# Patient Record
Sex: Female | Born: 1980 | Race: White | Hispanic: Yes | Marital: Married | Smoking: Never smoker
Health system: Southern US, Community
[De-identification: ages and names within clinical notes are randomized; demographics above are authoritative.]

## PROBLEM LIST (undated history)

## (undated) ENCOUNTER — Inpatient Hospital Stay (HOSPITAL_COMMUNITY): Payer: Self-pay

## (undated) DIAGNOSIS — O24419 Gestational diabetes mellitus in pregnancy, unspecified control: Secondary | ICD-10-CM

## (undated) DIAGNOSIS — R87613 High grade squamous intraepithelial lesion on cytologic smear of cervix (HGSIL): Secondary | ICD-10-CM

## (undated) HISTORY — DX: Gestational diabetes mellitus in pregnancy, unspecified control: O24.419

## (undated) HISTORY — PX: LEEP: SHX91

---

## 2008-06-26 ENCOUNTER — Inpatient Hospital Stay (HOSPITAL_COMMUNITY): Admission: AD | Admit: 2008-06-26 | Discharge: 2008-06-30 | Payer: Self-pay | Admitting: Obstetrics & Gynecology

## 2009-10-19 ENCOUNTER — Emergency Department (HOSPITAL_COMMUNITY): Admission: EM | Admit: 2009-10-19 | Discharge: 2009-10-19 | Payer: Self-pay | Admitting: Emergency Medicine

## 2010-01-31 ENCOUNTER — Ambulatory Visit (HOSPITAL_COMMUNITY): Admission: RE | Admit: 2010-01-31 | Discharge: 2010-01-31 | Payer: Self-pay | Admitting: Family Medicine

## 2010-02-27 ENCOUNTER — Ambulatory Visit (HOSPITAL_COMMUNITY): Admission: RE | Admit: 2010-02-27 | Discharge: 2010-02-27 | Payer: Self-pay | Admitting: Obstetrics & Gynecology

## 2010-06-03 ENCOUNTER — Inpatient Hospital Stay (HOSPITAL_COMMUNITY): Admission: AD | Admit: 2010-06-03 | Discharge: 2010-06-06 | Payer: Self-pay | Admitting: Family Medicine

## 2010-06-03 ENCOUNTER — Ambulatory Visit: Payer: Self-pay | Admitting: Advanced Practice Midwife

## 2011-02-23 LAB — CBC
HCT: 35.5 % — ABNORMAL LOW (ref 36.0–46.0)
Hemoglobin: 12.1 g/dL (ref 12.0–15.0)
Platelets: 205 10*3/uL (ref 150–400)
WBC: 7.4 10*3/uL (ref 4.0–10.5)

## 2011-02-23 LAB — RPR: RPR Ser Ql: NONREACTIVE

## 2011-04-22 NOTE — Op Note (Signed)
Kristi Nguyen, Kristi Nguyen   ACCOUNT NO.:  [id number]   MEDICAL RECORD NO.:  [id number]          PATIENT TYPE:  INP   LOCATION:  9124                          FACILITY:  [facility]   PHYSICIAN:  Charles A. Clearance Coots, M.D.DATE OF BIRTH:  [birth year]   DATE OF PROCEDURE:  06/27/2008  DATE OF DISCHARGE:                               OPERATIVE REPORT   PREOPERATIVE DIAGNOSIS:  Arrest of descent, occiput posterior position.   POSTOPERATIVE DIAGNOSIS:  Arrest of descent, occiput posterior position.   PROCEDURE:  Primary low transverse cesarean section.   SURGEON:  Charles A. Clearance Coots, M.D.   ASSISTANT:  Trinna Balloon, surgical technician   ANESTHESIA:  Epidural.   ESTIMATED BLOOD LOSS:  800 mL.   IV FLUIDS:  2300 mL.   URINE OUTPUT:  200 mL, clear.   COMPLICATIONS:  None.   DRAINS:  Foley to gravity.   FINDINGS:  Viable female at 2158, Apgars of 9 at one minute, 9 at five  minutes.  Weight of 10 pounds and 2 ounces.  Normal uterus, ovaries, and  fallopian tubes.   OPERATION:  The patient was brought to the operating room and after a  satisfactory redosing of the epidural, the abdomen was prepped and  draped in a usual sterile fashion.  A Pfannenstiel skin incision was  made with the scalpel that was deepened down to the fascia with a  scalpel.  Fascia was nicked in the midline and the fascial incision was  extended to the left and to the right with curved Mayo scissors.  The  superior and inferior fascial edges were taken off of the rectus muscle  with blunt and sharp dissections.  The rectus muscle was sharply divided  in the midline superiorly and inferiorly being careful to avoid the  urinary bladder inferiorly.  Peritoneum was then entered digitally and  was digitally extended to the left and to the right.  The bladder blade  was positioned and the vesicouterine fold of peritoneum above the  reflection of urinary bladder was grasped with a forceps and was incised  and  undermined with Metzenbaum scissors.  The incision was extended to  the left and to the right with the Metzenbaum scissors.  The bladder  flap was then bluntly developed and the bladder blade was repositioned  in front of the urinary bladder placing it well out of the operative  field.  The uterus was then entered transversely in the lower uterine  segment with a scalpel.  Purulent appearing amniotic fluid was expelled.  The uterine incision was then extended to left and to the right with the  bandage scissors.  The vertex was noted to be in the left occiput  posterior position and the occiput was rotated anteriorly into the  incision and the delivery was accomplished with the aid of fundal  pressure from the assistant.  Infant's mouth and nose were suctioned  with a suction bulb, and the delivery was completed with the aid of  fundal pressure from the assistant.  Umbilical cord was doubly clamped  and cut and the infant was handed off to the nursery staff.  Cord blood  was obtained and the  placenta was manually removed from the uterus  intact.  The endometrial surface was thoroughly debrided with a dry lap  sponge.  The edges of the uterine incision were grasped with a ring  forceps.  The uterus was closed with a continuous interlocking suture of  0-Monocryl from each corner to the center.  Hemostasis was excellent.  Pelvic cavity was thoroughly irrigated with warm saline solution and all  clots were removed.  The abdomen was then closed as follows.  Peritoneum  was closed with continuous suture of 2-0 Monocryl.  The fascia was  closed with a continuous suture of 0-Vicryl.  Subcutaneous tissue was  thoroughly irrigated with warm saline solution and all areas of  subcutaneous bleeding were coagulated with the Bovie.  The skin was then  approximated with surgical stainless steel staples.  Sterile bandage was  applied to the incision closure.  The patient tolerated the procedure  well, was  transported to the recovery room in satisfactory condition.      Charles A. Clearance Coots, M.D.  Electronically Signed     CAH/MEDQ  D:  06/27/2008  T:  06/28/2008  Job:  [id number]

## 2011-04-25 NOTE — Discharge Summary (Signed)
Kristi Nguyen, Kristi Nguyen   ACCOUNT NO.:  [id number]   MEDICAL RECORD NO.:  [id number]          PATIENT TYPE:  INP   LOCATION:  9124                          FACILITY:  [facility]   PHYSICIAN:  Roseanna Rainbow, M.D.DATE OF BIRTH:  05-17-1981   DATE OF ADMISSION:  06/26/2008  DATE OF DISCHARGE:  06/30/2008                               DISCHARGE SUMMARY   CHIEF COMPLAINT:  The patient is a 30 year old gravida 2, para 1 with an  estimated date of confinement of June 16, 2008, who presents for  induction of labor secondary to post-dates.   HISTORY OF PRESENT ILLNESS:  Prenatal care uncomplicated to date, GBS  negative.   PAST SURGICAL HISTORY:  She denies.   PAST MEDICAL HISTORY:  She denies.   MEDICATIONS:  Prenatal vitamins.   ALLERGIES:  No known drug allergies.   SOCIAL HISTORY:  She is married.  She denies any tobacco, ethanol, or  drug use.   FAMILY HISTORY:  No major illnesses.   PHYSICAL EXAMINATION:  GENERAL:  Afebrile.  VITAL SIGNS:  Stable.  LUNGS:  Clear to auscultation bilaterally.  HEART:  Regular rate and rhythm.  ABDOMEN:  Gravid, nontender.  PELVIC:  The cervix is 2 cm dilated, 50% effaced with vertex at -2  station.   ASSESSMENT:  Primipara at term post-dates.   PLAN:  Admission and two-stage induction of labor.   HOSPITAL COURSE:  The patient was admitted.  The cervix was ripened.  She was then started on low-dose Pitocin.  She progressed to fully  dilated.  There was no further descent beyond +1 station.  She developed  fever and was felt to have possible evolving chorioamnionitis.  The  position was occiput-posterior.  The decision was made to proceed with a  cesarean delivery.  Please see the dictated operative summary.  On  postoperative day #1, her hemoglobin was 7.7.  She was hemodynamically  stable.  The remainder of her hospital course was uneventful.  She was  discharged to home on postoperative day #3, tolerating a regular diet.   DISCHARGE DIAGNOSIS:  Intrauterine pregnancy at term, arrest of descent,  persistent occiput-posterior presentation.   PROCEDURE:  Cesarean delivery.   CONDITION:  Stable.   DIET:  Regular.   ACTIVITY:  Progressive activity, pelvic rest.   MEDICATIONS:  Included Percocet, Micronor, prenatal vitamins, and  ferrous sulfate.   DISPOSITION:  The patient was to follow up in the office in 2 weeks.      Roseanna Rainbow, M.D.  Electronically Signed     LAJ/MEDQ  D:  07/21/2008  T:  07/22/2008  Job:  [id number]

## 2011-09-05 LAB — CBC
Hemoglobin: 11.5 — ABNORMAL LOW
Hemoglobin: 7.7 — CL
MCHC: 32.9
MCV: 88.8
MCV: 89.4
Platelets: 187
Platelets: 244
RBC: 3.93
RDW: 16 — ABNORMAL HIGH
WBC: 9.2

## 2012-12-08 DIAGNOSIS — R87613 High grade squamous intraepithelial lesion on cytologic smear of cervix (HGSIL): Secondary | ICD-10-CM

## 2012-12-08 HISTORY — DX: High grade squamous intraepithelial lesion on cytologic smear of cervix (HGSIL): R87.613

## 2012-12-20 ENCOUNTER — Encounter: Payer: Self-pay | Admitting: Obstetrics & Gynecology

## 2013-01-07 ENCOUNTER — Encounter: Payer: Self-pay | Admitting: Obstetrics & Gynecology

## 2013-01-07 ENCOUNTER — Ambulatory Visit (INDEPENDENT_AMBULATORY_CARE_PROVIDER_SITE_OTHER): Payer: Self-pay | Admitting: Obstetrics & Gynecology

## 2013-01-07 VITALS — BP 115/86 | HR 90 | Temp 97.9°F | Ht 62.0 in | Wt 141.0 lb

## 2013-01-07 DIAGNOSIS — IMO0002 Reserved for concepts with insufficient information to code with codable children: Secondary | ICD-10-CM

## 2013-01-07 DIAGNOSIS — R87613 High grade squamous intraepithelial lesion on cytologic smear of cervix (HGSIL): Secondary | ICD-10-CM

## 2013-01-07 DIAGNOSIS — N879 Dysplasia of cervix uteri, unspecified: Secondary | ICD-10-CM | POA: Insufficient documentation

## 2013-01-07 NOTE — Patient Instructions (Addendum)
Cervical Dysplasia Cervical dysplasia is a condition in which a woman has abnormal changes in the cells of her cervix. The cervix is the opening to the uterus (womb) between the vagina and the uterus. These changes are called cervical dysplasia and may be the first signs of cervical cancer. These cells can be taken from the cervix during a Pap test and then looked at under a microscope. With early detection, treatment, and close follow-up care, nearly all cervical dysplasia can be cured. If untreated, the mild to moderate stages of dysplasia often grow more severe.  RISK FACTORS  The following increase the risk for cervical dysplasia.  Having had a sexually transmitted disease, including:  Chlamydia.  Human papilloma virus (HPV).  Becoming sexually active before age 18.  Having had more than 1 sexual partner.  Not using protection, such as condoms, during sexual intercourse, especially with new sexual partners.  Having had cancer of the vagina or vulva.  Having a sexual partner whose previous partner had cancer of the cervix or cervical dysplasia.  Having a sexual partner who has or has had cancer of the penis.  Having a weakened immune system (HIV, organ transplant).  Being the daughter of a woman who took DES (diethylstilbestrol) during pregnancy.  A history of cervical cancer in a woman's sister or mother.  Smoking.  Having had an abnormal Pap test in the past. SYMPTOMS  There are usually no symptoms. If there are symptoms, they may be vague such as:  Abnormal vaginal discharge.  Bleeding between periods or following intercourse.  Bleeding during menopause.  Pain on intercourse (dyspareunia). DIAGNOSIS   The Pap test is the best way of detecting abnormalities of the cervix.  Biopsy (removing a piece of tissue to look at under the microscope) of the cervix when the Pap test is abnormal or when the Pap test is normal, but the cervix looks abnormal. TREATMENT   Catching and treating the changes early with Pap tests can prevent cervical cancer.  Cryotherapy freezes the abnormal cells with a steel tip instrument.  A laser can be used to remove the abnormal cells.  Loop electrocautery excision procedure (LEEP). This procedure uses a heated electrical loop to remove a cone-like portion of the cervix, including the cervical canal.  For more serious cases of cervical dysplasia, the abnormal tissue may be removed surgically by:  A cone biopsy (by cold knife, laser or LEEP). A procedure in which a portion of the center of the cervix with the cervical canal is removed.  The uterus and cervix are removed (hysterectomy). Your caregiver will advise you regarding the need and timing of Pap tests in your follow-up. Women who have been treated for dysplasia should be closely followed with pelvic exams and Pap tests. During the first year following treatment of cervical dysplasia, Pap tests should be done every 3 to 4 months. In the second year, the schedule is every 6 months, or as recommended by your caregiver. See your caregiver for new or worsening problems. HOME CARE INSTRUCTIONS   Follow the instructions and recommendations of your caregiver regarding medicines and follow-up appointments.  Only take over-the-counter or prescription medicines for pain or discomfort as directed by your caregiver.  Cramping and pelvic discomfort may follow cryotherapy. It is not abnormal to have watery discharge for several weeks after.  Laser, cone surgery, cryotherapy or LEEP can cause a bad smelling vaginal discharge. It may also cause vaginal bleeding for a couple weeks following the procedure. The discharge   may be black from the paste used to control bleeding from the cone site. This is normal.  Do not use tampons, have sexual intercourse or douche until your caregiver says it is okay. SEEK MEDICAL CARE IF:   You develop genital warts.  You need a prescription for  pain medicine following your treatment. SEEK IMMEDIATE MEDICAL CARE IF:   Your bleeding is heavier than a normal menstrual period.  You develop bright red bleeding, especially if you have blood clots.  You have a fever.  You have increasing cramps or pain not relieved with medicine.  You are lightheaded, unusually weak, or have fainting spells.  You have abnormal vaginal discharge.  You develop abdominal pain. PREVENTION   The surest way to prevent cervical dysplasia is to abstain from sexual intercourse.  Practice safe sex, use condoms and have only one sex partner who does not have other sex partners.  A Pap test is done to screen for cervical cancer.  The first Pap test should be done at age 21.  Between ages 21 and 29, Pap tests are repeated every 2 years.  Beginning at age 30, you are advised to have a Pap test every 3 years as long as your past 3 Pap tests have been normal.  Some women have medical problems that increase the chance of getting cervical cancer. Talk to your caregiver about these problems. It is especially important to talk to your caregiver if a new problem develops soon after your last Pap test. In these cases, your caregiver may recommend more frequent screening and Pap tests.  The above recommendations are the same for women who have or have not gotten the vaccine for HPV (Human Papillomavirus).  If you had a hysterectomy for a problem that was not a cancer or a condition that could lead to cancer, then you no longer need Pap tests. However, even if you no longer need a Pap test, a regular exam is a good idea to make sure no other problems are starting.   If you are between ages 65 and 70, and you have had normal Pap tests going back 10 years, you no longer need Pap tests. However, even if you no longer need a Pap test, a regular exam is a good idea to make sure no other problems are starting.   If you have had past treatment for cervical cancer or a  condition that could lead to cancer, you need Pap tests and screening for cancer for at least 20 years after your treatment.  If Pap tests have been discontinued, risk factors (such as a new sexual partner) need to be re-assessed to determine if screening should be resumed.  Some women may need screenings more often if they are at high risk for cervical cancer.  Your caregiver may do additional tests including:  Colposcopy. A procedure in which a special microscope magnifies the cells and allows the provider to closely examine the cervix, vagina, and vulva.  Biopsy. A small tissue sample is taken from the cervix, vagina or vulva. This is generally done in your caregivers office.  A cone biopsy (cold knife or laser). A large tissue sample is taken from the cervix. This procedure is usually done in an operating room under a general anesthetic. The cone often removes all abnormal tissue and so may also complete the treatment.  LEEP, also removing a circular portion of the cervix and is done in a doctors office under a local anesthetic.  Now there   is a vaccine, Gardasil, that was developed to prevent the HPV'S that can cause cancer of the cervix and genital warts. It is recommended for females ages 9 to 26. It should not be given to pregnant women until more is known about its effects on the fetus. Not all cancers of the cervix are caused by the HPV. Routine gynecology exams and Pap tests should continue as recommended by your caregiver. Document Released: 11/24/2005 Document Revised: 02/16/2012 Document Reviewed: 11/15/2008 ExitCare Patient Information 2013 [employer]. Procedimiento de escisin electroquirrgica con asa (Loop Electrosurgical Excision Procedure) El procedimiento de escisin electroquirrgica con asa es la extirpacin de una porcin de la parte inferior del tero (cuello). El procedimento se realiza cuando hay cambios significativamente anormales en las clulas del cuello del  tero. Estos cambios pueden causar cncer si se dejan sin tratar.   El procedimiento mismo slo demora algunos minutos. Generalmente se realiza en el consultorio del mdico. Se considera un procedimiento seguro para aquellas mujeres que desean o tratan de quedar embarazadas. Solo bajo raras circunstancias este procedimiento se realiza estando embarazada.  INFORME A SU MDICO:   Si est embarazada o no tuvo el ltimo perodo menstrual.  Alergias a alimentos o medicamentos.  Todos los medicamentos que utiliza, incluyendo vitaminas, hierbas, gotas oftlmicas, medicamentos de venta libre y cremas.  Uso de corticoides (por va oral o cremas).  Problemas anteriores debido a anestsicos o a medicamentos que disminuyen la sensibilidad.  Cirugas ginecolgicas previas.  Antecedentes de hemorragias o cogulos sanguneos.  Infecciones recientes o actuales en la vagina (herpes, enfermedades de transmisin sexual).  Otros problemas de salud. RIESGOS Y COMPLICACIONES  Sangrado.  Infecciones.  Lesiones en la vagina, la vejiga o el recto.  Obstruccin rara en la abertura del cuello que causa problemas durante la menstruacin (estenosis cervical). ANTES DEL PROCEDIMIENTO   No tome aspirina ni anticoagulantes durante la semana previa al procedimiento, o segn le hayan indicado.  Consuma una comida ligera antes del procedimiento.  Consulte a su mdico si debe cambiar o suspender los medicamentos que toma habitualmente.  Le administrarn un analgsico 1  2 horas antes del procedimiento. PROCEDIMIENTO   Se coloca un instrumento (espculo) en la vagina. Esto le permite al mdico observar el cuello.  Se aplica tintura de yodo para encontrar la zona de las clulas anormales.  Se inyecta un medicamento para adormecer el cuello (anestsico local).   Se pasa electricidad a travs de una delgada asa de alambre que luego se usa para extirpar (cauterizar) un pequeo segmento de la zona  afectada.  Se utiliza una ligera electrocauterizacin para sellar los vasos sanguneos pequeos y evitar el sangrado.  Aplicarn una pasta en la zona cauterizada para prevenir el sangrado.  La muestra de tejido se enva al laboratorio. Luego, se examina en el microscopio. DESPUS DEL PROCEDIMIENTO   Pdale a alguna persona que la lleve hasta su casa.  Puede sentir un clico moderado a suave.  Puede notar una secrecin vaginal negra por la pasta usada para prevenir el sangrado. Esto es normal.  Observe si tiene un sangrado excesivo. Esto requiere atencin mdica inmediata.  Consulte con su mdico la fecha en que los resultados estarn disponibles. Asegrese de obtener los resultados. Document Released: 08/06/2011 Document Revised: 02/16/2012 ExitCare Patient Information 2013 [employer].   

## 2013-01-07 NOTE — Progress Notes (Signed)
Subjective:     Patient ID: Kristi Nguyen, female   DOB: [birth year], 32 y.o.   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  She was referred for LEEP.   History reviewed. No pertinent past medical history.  Past Surgical History  Procedure Date  . Cesarean section    No current outpatient prescriptions on file prior to visit.   No Known Allergies  History   Social History  . Marital Status: Single    Spouse Name: N/A    Number of Children: N/A  . Years of Education: N/A   Occupational History  . Not on file.   Social History Main Topics  . Smoking status: Never Smoker   . Smokeless tobacco: Never Used  . Alcohol Use: No  . Drug Use: No  . Sexually Active: Yes    Birth Control/ Protection: IUD   Other Topics Concern  . Not on file   Social History Narrative  . 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  Pt has watched the video and I have reviewed with her the risks of dysplasia and the risks of the procdure.  Pt expressed understanding.  All conversation through spanish interpreter.  Pt want to speak to her husband prior to the procedure.     Plan:     F/u for LEEP

## 2013-01-28 ENCOUNTER — Other Ambulatory Visit: Payer: Self-pay | Admitting: Obstetrics & Gynecology

## 2013-01-28 ENCOUNTER — Other Ambulatory Visit (HOSPITAL_COMMUNITY)
Admission: RE | Admit: 2013-01-28 | Discharge: 2013-01-28 | Disposition: A | Discharge status: Home / Self Care | Payer: Self-pay | Source: Ambulatory Visit | Attending: Obstetrics & Gynecology | Admitting: Obstetrics & Gynecology

## 2013-01-28 ENCOUNTER — Ambulatory Visit (INDEPENDENT_AMBULATORY_CARE_PROVIDER_SITE_OTHER): Payer: Self-pay | Admitting: Obstetrics & Gynecology

## 2013-01-28 ENCOUNTER — Encounter: Payer: Self-pay | Admitting: Obstetrics & Gynecology

## 2013-01-28 VITALS — BP 115/82 | HR 87 | Temp 97.2°F | Ht 61.0 in | Wt 144.4 lb

## 2013-01-28 DIAGNOSIS — IMO0002 Reserved for concepts with insufficient information to code with codable children: Secondary | ICD-10-CM

## 2013-01-28 DIAGNOSIS — R87613 High grade squamous intraepithelial lesion on cytologic smear of cervix (HGSIL): Secondary | ICD-10-CM

## 2013-01-28 DIAGNOSIS — D069 Carcinoma in situ of cervix, unspecified: Secondary | ICD-10-CM | POA: Insufficient documentation

## 2013-01-28 LAB — POCT PREGNANCY, URINE
Preg Test, Ur: NEGATIVE
Preg Test, Ur: NEGATIVE

## 2013-01-28 NOTE — Patient Instructions (Signed)
Procedimiento de escisin electroquirrgica con asa - Cuidados posteriores  (Loop Electrosurgical Excision Procedure, Care After) Siga estas instrucciones durante las prximas semanas. Estas indicaciones le proporcionan informacin general acerca de cmo deber cuidarse despus del procedimiento. El mdico tambin podr darle instrucciones especficas. El tratamiento ha sido planificado segn las prcticas mdicas actuales, pero en algunos casos pueden ocurrir problemas. Comunquese con el mdico si tiene algn problema o tiene preguntas despus del procedimiento.  INSTRUCCIONES PARA EL CUIDADO EN EL HOGAR   No use tampones, no se d duchas vaginales ni tenga relaciones sexuales durante 2 semanas, o segn lo que le indique su mdico.  Comience con las actividades habituales si no tiene o tiene mnimo de clicos y Landscape architect, excepto que el mdico le indique lo contrario.  Tmese la temperatura si se siente enfermo. Anote la temperatura en un papel e informe a su mdico que tiene fiebre.  Tome todos los medicamentos segn le indic su mdico.  Cumpla con todas las visitas de control y los papanicolau, segn le indique su mdico. SOLICITE ATENCIN MDICA DE INMEDIATO SI:   Tiene un sangrado ms abundante o que dura ms que el ciclo menstrual normal.  Tiene un sangrado de color rojo brillante.  Elimina cogulos de [place].  Tiene fiebre.  Siente clicos o el dolor no se alivia con Tourist information centre manager.  Siente dolor abdominal que no parece estar relacionado con la misma zona en que sinti los clicos y Chief Technology Officer.  Se siente mareada, dbil o se desmaya.  Comienza a Financial risk analyst al orinar u [employer].  Tiene una secrecin vaginal con mal olor. ASEGRESE DE QUE:   Comprende estas instrucciones.  Controlar su enfermedad.  Solicitar ayuda de inmediato si no mejora o si empeora. Document Released: 08/07/2011 Document Revised: 02/16/2012 [facility] Patient Information 2013 [place],  [place]. Procedimiento de escisin electroquirrgica con asa (Loop Electrosurgical Excision Procedure) El procedimiento de escisin electroquirrgica con asa es la extirpacin de una porcin de la parte inferior del tero (cuello). El procedimento se realiza cuando hay cambios significativamente anormales en las clulas del cuello del tero. Estos cambios pueden causar cncer si se dejan sin tratar.   El procedimiento mismo slo demora algunos minutos. Generalmente se Electronics engineer del mdico. Se considera un procedimiento seguro para aquellas mujeres que desean o tratan de quedar embarazadas. Solo bajo raras circunstancias este procedimiento se realiza estando embarazada.  INFORME A SU MDICO:   Si est embarazada o no tuvo el ltimo perodo menstrual.  Alergias a alimentos o medicamentos.  Todos los [employer], incluyendo vitaminas, hierbas, gotas oftlmicas, medicamentos de [place] y Control and instrumentation engineer.  Uso de corticoides (por va oral o cremas).  Problemas anteriores debido a anestsicos o a medicamentos que [employer] sensibilidad.  Cirugas ginecolgicas previas.  Antecedentes de hemorragias o cogulos sanguneos.  Infecciones recientes o actuales en la vagina (herpes, enfermedades de transmisin sexual).  Otros problemas de [place]. RIESGOS Y COMPLICACIONES  Sangrado.  Infecciones.  Lesiones en la vagina, la vejiga o el recto.  Obstruccin rara en la abertura del cuello que causa problemas durante la menstruacin (estenosis cervical). ANTES DEL PROCEDIMIENTO   No tome aspirina ni anticoagulantes durante la semana previa al procedimiento, o segn le hayan indicado.  Consuma una comida ligera antes del procedimiento.  Consulte a su mdico si debe cambiar o suspender los medicamentos que toma habitualmente.  Le administrarn un analgsico 1  2 horas antes del procedimiento. PROCEDIMIENTO   Se coloca un instrumento (espculo) en  la vagina. Esto le  permite al mdico observar el cuello.  Se aplica tintura de yodo para encontrar la zona de las clulas anormales.  Se inyecta un medicamento para adormecer el cuello (anestsico local).   Se pasa electricidad a travs de una delgada asa de alambre que luego se [place] para extirpar (cauterizar) un pequeo segmento de la zona afectada.  Se utiliza una ligera electrocauterizacin para sellar los vasos sanguneos pequeos y Social research officer, government.  Aplicarn una pasta en la zona cauterizada para prevenir el sangrado.  La muestra de tejido se enva al laboratorio. Luego, se examina en el microscopio. DESPUS DEL PROCEDIMIENTO   Pdale a alguna persona que la lleve hasta su casa.  Puede sentir un clico moderado a suave.  Puede notar una secrecin vaginal negra por la pasta usada para prevenir el sangrado. Esto es normal.  Observe si tiene un sangrado excesivo. Esto requiere atencin mdica inmediata.  Consulte con su mdico la fecha en que los resultados estarn disponibles. Asegrese de [employer]. Document Released: 08/06/2011 Document Revised: 02/16/2012 [facility] Patient Information 2013 [place], [place].

## 2013-01-28 NOTE — Progress Notes (Signed)
Patient ID: Kristi Nguyen, female   DOB: 1981/08/24, 31 y.o.   MRN: [id number] Pap smear and colposcopy reviewed.   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  Role of HPV,cervical dysplasia and need for close followup was empasized. Informed written consent was obtained. All questions were answered. Time out performed.  Procedure: The patient was placed in lithotomy position and the bivalved coated speculum was placed in the patient's vagina. A grounding pad placed on the patient. Local anesthesia was administered via an intracervical block using 10cc of 2% Lidocaine with epinephrine. The suction was turned on and the Large 1X Fisher Cone Biopsy Excisor on 50 Watts of cutting current was used to excise the area of decreased uptake and excise the entire transformation zone. Excellent hemostasis was achieved using roller ball coagulation set at 60 Watts coagulation current. Monsel's solution was then applied and excellent hemostasis was noted.  The speculum was removed from the vagina. Specimens were sent to pathology. The patient tolerated the procedure well. Post-operative instructions given to patient, including instruction to seek medical attention for persistent bright red bleeding, fever, abdominal/pelvic pain, dysuria, nausea or vomiting. She was also told about the possibility of having copious yellow to black tinged discharge. She was counseled to avoid anything in the vagina (sex/douching/tampons) for 4 weeks. She has a  4 week post-operative check to review results and assess wound healing.

## 2013-02-25 ENCOUNTER — Encounter: Payer: Self-pay | Admitting: Obstetrics & Gynecology

## 2013-02-25 ENCOUNTER — Ambulatory Visit (INDEPENDENT_AMBULATORY_CARE_PROVIDER_SITE_OTHER): Payer: Self-pay | Admitting: Obstetrics & Gynecology

## 2013-02-25 VITALS — BP 125/75 | HR 76 | Temp 97.6°F | Ht 62.0 in | Wt 146.1 lb

## 2013-02-25 DIAGNOSIS — N879 Dysplasia of cervix uteri, unspecified: Secondary | ICD-10-CM

## 2013-02-25 NOTE — Progress Notes (Signed)
Subjective:     Patient ID: Kristi Nguyen, female   DOB: [birth year], 32 y.o.   MRN: [id number]  HPI Pt presents for f/u after LEEP.  She has no complaints.  No bleeding or abnormal discharge.   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 SEE COMMENT. - ECTOCERVICAL AND ENDOCERVICAL MARGINS, NEGATIVE FOR DYSPLASIA.     Assessment:     dysplasia s/p LEEP margins clear    Plan:     F/u 6 months for repeat PAP F/u sooner prn

## 2013-02-25 NOTE — Patient Instructions (Addendum)
Cervical Dysplasia Cervical dysplasia is a condition in which a woman has abnormal changes in the cells of her cervix. The cervix is the opening to the uterus (womb) between the vagina and the uterus. These changes are called cervical dysplasia and may be the first signs of cervical cancer. These cells can be taken from the cervix during a Pap test and then looked at under a microscope. With early detection, treatment, and close follow-up care, nearly all cervical dysplasia can be cured. If untreated, the mild to moderate stages of dysplasia often grow more severe.  RISK FACTORS  The following increase the risk for cervical dysplasia.  Having had a sexually transmitted disease, including:  Chlamydia.  Human papilloma virus (HPV).  Becoming sexually active before age 18.  Having had more than 1 sexual partner.  Not using protection, such as condoms, during sexual intercourse, especially with new sexual partners.  Having had cancer of the vagina or vulva.  Having a sexual partner whose previous partner had cancer of the cervix or cervical dysplasia.  Having a sexual partner who has or has had cancer of the penis.  Having a weakened immune system (HIV, organ transplant).  Being the daughter of a woman who took DES (diethylstilbestrol) during pregnancy.  A history of cervical cancer in a woman's sister or mother.  Smoking.  Having had an abnormal Pap test in the past. SYMPTOMS  There are usually no symptoms. If there are symptoms, they may be vague such as:  Abnormal vaginal discharge.  Bleeding between periods or following intercourse.  Bleeding during menopause.  Pain on intercourse (dyspareunia). DIAGNOSIS   The Pap test is the best way of detecting abnormalities of the cervix.  Biopsy (removing a piece of tissue to look at under the microscope) of the cervix when the Pap test is abnormal or when the Pap test is normal, but the cervix looks abnormal. TREATMENT    Catching and treating the changes early with Pap tests can prevent cervical cancer.  Cryotherapy freezes the abnormal cells with a steel tip instrument.  A laser can be used to remove the abnormal cells.  Loop electrocautery excision procedure (LEEP). This procedure uses a heated electrical loop to remove a cone-like portion of the cervix, including the cervical canal.  For more serious cases of cervical dysplasia, the abnormal tissue may be removed surgically by:  A cone biopsy (by cold knife, laser or LEEP). A procedure in which a portion of the center of the cervix with the cervical canal is removed.  The uterus and cervix are removed (hysterectomy). Your caregiver will advise you regarding the need and timing of Pap tests in your follow-up. Women who have been treated for dysplasia should be closely followed with pelvic exams and Pap tests. During the first year following treatment of cervical dysplasia, Pap tests should be done every 3 to 4 months. In the second year, the schedule is every 6 months, or as recommended by your caregiver. See your caregiver for new or worsening problems. HOME CARE INSTRUCTIONS   Follow the instructions and recommendations of your caregiver regarding medicines and follow-up appointments.  Only take over-the-counter or prescription medicines for pain or discomfort as directed by your caregiver.  Cramping and pelvic discomfort may follow cryotherapy. It is not abnormal to have watery discharge for several weeks after.  Laser, cone surgery, cryotherapy or LEEP can cause a bad smelling vaginal discharge. It may also cause vaginal bleeding for a couple weeks following the procedure. The   discharge may be black from the paste used to control bleeding from the cone site. This is normal.  Do not use tampons, have sexual intercourse or douche until your caregiver says it is okay. SEEK MEDICAL CARE IF:   You develop genital warts.  You need a prescription for  pain medicine following your treatment. SEEK IMMEDIATE MEDICAL CARE IF:   Your bleeding is heavier than a normal menstrual period.  You develop bright red bleeding, especially if you have blood clots.  You have a fever.  You have increasing cramps or pain not relieved with medicine.  You are lightheaded, unusually weak, or have fainting spells.  You have abnormal vaginal discharge.  You develop abdominal pain. PREVENTION   The surest way to prevent cervical dysplasia is to abstain from sexual intercourse.  Practice safe sex, use condoms and have only one sex partner who does not have other sex partners.  A Pap test is done to screen for cervical cancer.  The first Pap test should be done at age 21.  Between ages 21 and 29, Pap tests are repeated every 2 years.  Beginning at age 30, you are advised to have a Pap test every 3 years as long as your past 3 Pap tests have been normal.  Some women have medical problems that increase the chance of getting cervical cancer. Talk to your caregiver about these problems. It is especially important to talk to your caregiver if a new problem develops soon after your last Pap test. In these cases, your caregiver may recommend more frequent screening and Pap tests.  The above recommendations are the same for women who have or have not gotten the vaccine for HPV (Human Papillomavirus).  If you had a hysterectomy for a problem that was not a cancer or a condition that could lead to cancer, then you no longer need Pap tests. However, even if you no longer need a Pap test, a regular exam is a good idea to make sure no other problems are starting.   If you are between ages 65 and 70, and you have had normal Pap tests going back 10 years, you no longer need Pap tests. However, even if you no longer need a Pap test, a regular exam is a good idea to make sure no other problems are starting.   If you have had past treatment for cervical cancer or a  condition that could lead to cancer, you need Pap tests and screening for cancer for at least 20 years after your treatment.  If Pap tests have been discontinued, risk factors (such as a new sexual partner) need to be re-assessed to determine if screening should be resumed.  Some women may need screenings more often if they are at high risk for cervical cancer.  Your caregiver may do additional tests including:  Colposcopy. A procedure in which a special microscope magnifies the cells and allows the provider to closely examine the cervix, vagina, and vulva.  Biopsy. A small tissue sample is taken from the cervix, vagina or vulva. This is generally done in your caregivers office.  A cone biopsy (cold knife or laser). A large tissue sample is taken from the cervix. This procedure is usually done in an operating room under a general anesthetic. The cone often removes all abnormal tissue and so may also complete the treatment.  LEEP, also removing a circular portion of the cervix and is done in a doctors office under a local anesthetic.  Now   there is a vaccine, Gardasil, that was developed to prevent the HPV'S that can cause cancer of the cervix and genital warts. It is recommended for females ages 9 to 26. It should not be given to pregnant women until more is known about its effects on the fetus. Not all cancers of the cervix are caused by the HPV. Routine gynecology exams and Pap tests should continue as recommended by your caregiver. Document Released: 11/24/2005 Document Revised: 02/16/2012 Document Reviewed: 11/15/2008 Exit[employer] Patient Information 2013 [employer].  

## 2013-08-12 ENCOUNTER — Ambulatory Visit (INDEPENDENT_AMBULATORY_CARE_PROVIDER_SITE_OTHER): Payer: Self-pay | Admitting: Obstetrics & Gynecology

## 2013-08-12 ENCOUNTER — Encounter: Payer: Self-pay | Admitting: Obstetrics & Gynecology

## 2013-08-12 VITALS — BP 116/68 | HR 81 | Temp 99.2°F | Ht 62.0 in | Wt 151.0 lb

## 2013-08-12 DIAGNOSIS — N879 Dysplasia of cervix uteri, unspecified: Secondary | ICD-10-CM

## 2013-08-12 DIAGNOSIS — R87613 High grade squamous intraepithelial lesion on cytologic smear of cervix (HGSIL): Secondary | ICD-10-CM

## 2013-08-12 DIAGNOSIS — IMO0002 Reserved for concepts with insufficient information to code with codable children: Secondary | ICD-10-CM

## 2013-08-12 NOTE — Patient Instructions (Signed)
Cervical Dysplasia  Cervical dysplasia is a condition in which a woman has abnormal changes in the cells of her cervix. The cervix is the opening to the uterus (womb) between the vagina and the uterus. These changes are called cervical dysplasia and may be the first signs of cervical cancer. These cells can be taken from the cervix during a Pap test and then looked at under a microscope.  With early detection, treatment, and close follow-up care, nearly all cervical dysplasia can be cured. If untreated, the mild to moderate stages of dysplasia often grow more severe.   RISK FACTORS   The following increase the risk for cervical dysplasia.  · Having had a sexually transmitted disease, including:  · Chlamydia.  · Human papilloma virus (HPV).  · Becoming sexually active before age 18.  · Having had more than 1 sexual partner.  · Not using protection, such as condoms, during sexual intercourse, especially with new sexual partners.  · Having had cancer of the vagina or vulva.  · Having a sexual partner whose previous partner had cancer of the cervix or cervical dysplasia.  · Having a sexual partner who has or has had cancer of the penis.  · Having a weakened immune system (HIV, organ transplant).  · Being the daughter of a woman who took DES (diethylstilbestrol) during pregnancy.  · A history of cervical cancer in a woman's sister or mother.  · Smoking.  · Having had an abnormal Pap test in the past.  SYMPTOMS   There are usually no symptoms. If there are symptoms, they may be vague such as:  · Abnormal vaginal discharge.  · Bleeding between periods or following intercourse.  · Bleeding during menopause.  · Pain on intercourse (dyspareunia).  DIAGNOSIS   · The Pap test is the best way of detecting abnormalities of the cervix.  · Biopsy (removing a piece of tissue to look at under the microscope) of the cervix when the Pap test is abnormal or when the Pap test is normal, but the cervix looks abnormal.  TREATMENT    Catching and treating the changes early with Pap tests can prevent cervical cancer.  · Cryotherapy freezes the abnormal cells with a steel tip instrument.  · A laser can be used to remove the abnormal cells.  · Loop electrocautery excision procedure (LEEP). This procedure uses a heated electrical loop to remove a cone-like portion of the cervix, including the cervical canal.  · For more serious cases of cervical dysplasia, the abnormal tissue may be removed surgically by:  · A cone biopsy (by cold knife, laser or LEEP). A procedure in which a portion of the center of the cervix with the cervical canal is removed.  · The uterus and cervix are removed (hysterectomy).  Your caregiver will advise you regarding the need and timing of Pap tests in your follow-up. Women who have been treated for dysplasia should be closely followed with pelvic exams and Pap tests. During the first year following treatment of cervical dysplasia, Pap tests should be done every 3 to 4 months. In the second year, the schedule is every 6 months, or as recommended by your caregiver. See your caregiver for new or worsening problems.  HOME CARE INSTRUCTIONS   · Follow the instructions and recommendations of your caregiver regarding medicines and follow-up appointments.  · Only take over-the-counter or prescription medicines for pain or discomfort as directed by your caregiver.  · Cramping and pelvic discomfort may follow cryotherapy. It   is not abnormal to have watery discharge for several weeks after.  · Laser, cone surgery, cryotherapy or LEEP can cause a bad smelling vaginal discharge. It may also cause vaginal bleeding for a couple weeks following the procedure. The discharge may be black from the paste used to control bleeding from the cone site. This is normal.  · Do not use tampons, have sexual intercourse or douche until your caregiver says it is okay.  SEEK MEDICAL CARE IF:   · You develop genital warts.  · You need a prescription for  pain medicine following your treatment.  SEEK IMMEDIATE MEDICAL CARE IF:   · Your bleeding is heavier than a normal menstrual period.  · You develop bright red bleeding, especially if you have blood clots.  · You have a fever.  · You have increasing cramps or pain not relieved with medicine.  · You are lightheaded, unusually weak, or have fainting spells.  · You have abnormal vaginal discharge.  · You develop abdominal pain.  PREVENTION   · The surest way to prevent cervical dysplasia is to abstain from sexual intercourse.  · Practice safe sex, use condoms and have only one sex partner who does not have other sex partners.  · A Pap test is done to screen for cervical cancer.  · The first Pap test should be done at age 21.  · Between ages 21 and 29, Pap tests are repeated every 2 years.  · Beginning at age 30, you are advised to have a Pap test every 3 years as long as your past 3 Pap tests have been normal.  · Some women have medical problems that increase the chance of getting cervical cancer. Talk to your caregiver about these problems. It is especially important to talk to your caregiver if a new problem develops soon after your last Pap test. In these cases, your caregiver may recommend more frequent screening and Pap tests.  · The above recommendations are the same for women who have or have not gotten the vaccine for HPV (Human Papillomavirus).  · If you had a hysterectomy for a problem that was not a cancer or a condition that could lead to cancer, then you no longer need Pap tests. However, even if you no longer need a Pap test, a regular exam is a good idea to make sure no other problems are starting.     · If you are between ages 65 and 70, and you have had normal Pap tests going back 10 years, you no longer need Pap tests. However, even if you no longer need a Pap test, a regular exam is a good idea to make sure no other problems are starting.     · If you have had past treatment for cervical cancer or a  condition that could lead to cancer, you need Pap tests and screening for cancer for at least 20 years after your treatment.   · If Pap tests have been discontinued, risk factors (such as a new sexual partner)  need to be re-assessed to determine if screening should be resumed.  · Some women may need screenings more often if they are at high risk for cervical cancer.  · Your caregiver may do additional tests including:  · Colposcopy. A procedure in which a special microscope magnifies the cells and allows the provider to closely examine the cervix, vagina, and vulva.  · Biopsy. A small tissue sample is taken from the cervix, vagina or vulva. This is generally done in your   caregivers office.  · A cone biopsy (cold knife or laser). A large tissue sample is taken from the cervix. This procedure is usually done in an operating room under a general anesthetic. The cone often removes all abnormal tissue and so may also complete the treatment.  · LEEP, also removing a circular portion of the cervix and is done in a doctors office under a local anesthetic.  · Now there is a vaccine, Gardasil, that was developed to prevent the HPV'S that can cause cancer of the cervix and genital warts. It is recommended for females ages 9 to 26. It should not be given to pregnant women until more is known about its effects on the fetus. Not all cancers of the cervix are caused by the HPV. Routine gynecology exams and Pap tests should continue as recommended by your caregiver.  Document Released: 11/24/2005 Document Revised: 02/16/2012 Document Reviewed: 11/15/2008  [employer]® Patient Information ©2014 [employer].

## 2013-08-12 NOTE — Progress Notes (Signed)
Subjective:     Patient ID: Kristi Nguyen, female   DOB: 1981-10-30, 32 y.o.   MRN: [id number]  HPI Pt presents for repeat PAP.  She denies problems.   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 SEE COMMENT. - ECTOCERVICAL AND ENDOCERVICAL MARGINS, NEGATIVE FOR DYSPLASIA. 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 (CRR:eps 01/31/13) [place] RUND DO Pathologist, Electronic Signature (Case signed 01/31/2013) Specimen Gross and Clinical Information Specimen(s) Obtained: Cervix, LEEP     Assessment:     h/o LEEP for high grade dysplasia with clean margins for repeat PAP      Plan:     F/u repeat PAP F/u 1 year or sooner prn

## 2014-09-07 ENCOUNTER — Other Ambulatory Visit: Payer: Self-pay | Admitting: Nurse Practitioner

## 2014-09-07 DIAGNOSIS — Z30431 Encounter for routine checking of intrauterine contraceptive device: Secondary | ICD-10-CM

## 2014-09-25 ENCOUNTER — Other Ambulatory Visit: Payer: Self-pay

## 2014-09-25 ENCOUNTER — Ambulatory Visit
Admission: RE | Admit: 2014-09-25 | Discharge: 2014-09-25 | Disposition: A | Discharge status: Home / Self Care | Payer: No Typology Code available for payment source | Source: Ambulatory Visit | Attending: Nurse Practitioner | Admitting: Nurse Practitioner

## 2014-09-25 DIAGNOSIS — Z30431 Encounter for routine checking of intrauterine contraceptive device: Secondary | ICD-10-CM

## 2018-01-31 ENCOUNTER — Encounter (HOSPITAL_COMMUNITY): Payer: Self-pay | Admitting: *Deleted

## 2018-01-31 ENCOUNTER — Emergency Department (HOSPITAL_COMMUNITY): Payer: Self-pay

## 2018-01-31 ENCOUNTER — Emergency Department (HOSPITAL_COMMUNITY)
Admission: EM | Admit: 2018-01-31 | Discharge: 2018-01-31 | Disposition: A | Discharge status: Home / Self Care | Payer: Self-pay | Attending: Emergency Medicine | Admitting: Emergency Medicine

## 2018-01-31 ENCOUNTER — Other Ambulatory Visit: Payer: Self-pay

## 2018-01-31 DIAGNOSIS — O200 Threatened abortion: Secondary | ICD-10-CM | POA: Insufficient documentation

## 2018-01-31 DIAGNOSIS — N939 Abnormal uterine and vaginal bleeding, unspecified: Secondary | ICD-10-CM

## 2018-01-31 DIAGNOSIS — O209 Hemorrhage in early pregnancy, unspecified: Secondary | ICD-10-CM | POA: Insufficient documentation

## 2018-01-31 DIAGNOSIS — O034 Incomplete spontaneous abortion without complication: Secondary | ICD-10-CM

## 2018-01-31 LAB — COMPREHENSIVE METABOLIC PANEL
ALT: 19 U/L (ref 14–54)
ANION GAP: 11 (ref 5–15)
AST: 23 U/L (ref 15–41)
Albumin: 3.9 g/dL (ref 3.5–5.0)
Alkaline Phosphatase: 66 U/L (ref 38–126)
BUN: <5 mg/dL — ABNORMAL LOW (ref 6–20)
CO2: 22 mmol/L (ref 22–32)
CREATININE: 0.64 mg/dL (ref 0.44–1.00)
Calcium: 8.8 mg/dL — ABNORMAL LOW (ref 8.9–10.3)
Chloride: 102 mmol/L (ref 101–111)
Glucose, Bld: 122 mg/dL — ABNORMAL HIGH (ref 65–99)
Potassium: 3.1 mmol/L — ABNORMAL LOW (ref 3.5–5.1)
SODIUM: 135 mmol/L (ref 135–145)
Total Bilirubin: 0.4 mg/dL (ref 0.3–1.2)
Total Protein: 7.3 g/dL (ref 6.5–8.1)

## 2018-01-31 LAB — HCG, QUANTITATIVE, PREGNANCY: hCG, Beta Chain, Quant, S: 2748 m[IU]/mL — ABNORMAL HIGH (ref <5)

## 2018-01-31 LAB — WET PREP, GENITAL
Clue Cells Wet Prep HPF POC: NONE SEEN
SPERM: NONE SEEN
Trich, Wet Prep: NONE SEEN
Yeast Wet Prep HPF POC: NONE SEEN

## 2018-01-31 LAB — URINALYSIS, ROUTINE W REFLEX MICROSCOPIC
Bacteria, UA: NONE SEEN
Squamous Epithelial / LPF: NONE SEEN

## 2018-01-31 LAB — CBC
HCT: 34.3 % — ABNORMAL LOW (ref 36.0–46.0)
HEMOGLOBIN: 11.3 g/dL — AB (ref 12.0–15.0)
MCH: 29.4 pg (ref 26.0–34.0)
MCHC: 32.9 g/dL (ref 30.0–36.0)
MCV: 89.3 fL (ref 78.0–100.0)
PLATELETS: 287 10*3/uL (ref 150–400)
RBC: 3.84 MIL/uL — AB (ref 3.87–5.11)
RDW: 14.3 % (ref 11.5–15.5)
WBC: 7.5 10*3/uL (ref 4.0–10.5)

## 2018-01-31 MED ORDER — SODIUM CHLORIDE 0.9 % IV BOLUS (SEPSIS)
1000.0000 mL | Freq: Once | INTRAVENOUS | Status: AC
  Administered 2018-01-31: 1000 mL via INTRAVENOUS

## 2018-01-31 NOTE — ED Triage Notes (Signed)
Pt has been coughing for several days, took cough medications tonight. Shortly after, pt began having heavy vaginal bleeding with large clots and lower abd pain. Pt missed her period in January and believes she could be pregnant.

## 2018-01-31 NOTE — Discharge Instructions (Signed)
1. Medications: usual home medications 2. Treatment: rest, drink plenty of fluids,  3. Follow Up: [facility] days for discussion of your diagnoses and further evaluation after today's visit; Please return to the ER for syncope, lightheadedness, worsening bleeding or fevers

## 2018-01-31 NOTE — ED Provider Notes (Signed)
[facility] EMERGENCY DEPARTMENT Provider Note   CSN: [id number] Arrival date & time: 01/31/18  0121     History   Chief Complaint Chief Complaint  Patient presents with  . Threatened Miscarriage    HPI Kristi Nguyen is a 37 y.o. female G2P0202 with a hx of cervical dysplasia presents to the Emergency Department complaining of gradual, persistent, progressively worsening vaginal bleeding.  Pt reports "normal menstrual bleeding"  Onset 3 days ago, but tonight around 7pm she developed cramping and heavier bleeding. Associated symptoms include lower abd cramping.  Pt has not taken a pregnancy test at home and is not under the care of an OB/GYN.  Nothing makes it better and nothing makes it worse.  Pt denies fever, chills, lightheadedness, dizziness, SOB, DOE, CP, dysuria.  LMP 12/10/17.  She reports she is sexually active with 1 female partner.  Pt also reports cough for several days.  Pt reports taking cough medication recently and is concerned this is a result of that.     The history is provided by the patient and medical records. The history is limited by a language barrier. A language interpreter was used.    History reviewed. No pertinent past medical history.  Patient Active Problem List   Diagnosis Date Noted  . 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 CESAREAN SECTION      OB History    Gravida Para Term Preterm AB Living   3 3 3     3    SAB TAB Ectopic Multiple Live Births                   Home Medications    Prior to Admission medications   Medication Sig Start Date End Date Taking? Authorizing Provider  guaifenesin (ROBITUSSIN) 100 MG/5ML syrup Take 200 mg by mouth 3 (three) times daily as needed for cough.   Yes [provider]    Family History No family history on file.  Social History Social History   Tobacco Use  . Smoking status: Never Smoker  . Smokeless tobacco:  Never Used  Substance Use Topics  . Alcohol use: No  . Drug use: No     Allergies   Patient has no known allergies.   Review of Systems Review of Systems  Constitutional: Negative for appetite change, diaphoresis, fatigue, fever and unexpected weight change.  HENT: Negative for mouth sores.   Eyes: Negative for visual disturbance.  Respiratory: Positive for cough. Negative for chest tightness, shortness of breath and wheezing.   Cardiovascular: Negative for chest pain.  Gastrointestinal: Positive for abdominal pain (lower). Negative for constipation, diarrhea, nausea and vomiting.  Endocrine: Negative for polydipsia, polyphagia and polyuria.  Genitourinary: Positive for vaginal bleeding. Negative for dysuria, frequency, hematuria and urgency.  Musculoskeletal: Negative for back pain and neck stiffness.  Skin: Negative for rash.  Allergic/Immunologic: Negative for immunocompromised state.  Neurological: Negative for syncope, light-headedness and headaches.  Hematological: Does not bruise/bleed easily.  Psychiatric/Behavioral: Negative for sleep disturbance. The patient is not nervous/anxious.      Physical Exam Updated Vital Signs BP (!) 150/98 (BP Location: Right Arm)   Pulse (!) 136   Temp 98.7 F (37.1 C) (Oral)   Resp 15   SpO2 100%   Physical Exam  Constitutional: She appears well-developed and well-nourished. No distress.  Awake, alert, nontoxic appearance  HENT:  Head: Normocephalic and atraumatic.  Mouth/Throat: Oropharynx is clear and moist.  No oropharyngeal exudate.  Eyes: Conjunctivae are normal. No scleral icterus.  Neck: Normal range of motion. Neck supple.  Cardiovascular: Regular rhythm, normal heart sounds and intact distal pulses. Tachycardia present.  No murmur heard. Pulses:      Radial pulses are 2+ on the right side, and 2+ on the left side.  Pulmonary/Chest: Effort normal and breath sounds normal. No respiratory distress. She has no wheezes.    Equal chest expansion  Abdominal: Soft. Bowel sounds are normal. She exhibits no mass. There is no tenderness. There is no rebound and no guarding. Hernia confirmed negative in the right inguinal area and confirmed negative in the left inguinal area.  Genitourinary: Uterus normal. No labial fusion. There is no rash, tenderness or lesion on the right labia. There is no rash, tenderness or lesion on the left labia. Uterus is not deviated, not enlarged, not fixed and not tender. Cervix exhibits no motion tenderness, no discharge and no friability. Right adnexum displays no mass, no tenderness and no fullness. Left adnexum displays no mass, no tenderness and no fullness. There is bleeding in the vagina. No erythema or tenderness in the vagina. No foreign body in the vagina. No signs of injury around the vagina. No vaginal discharge found.  Genitourinary Comments: Chaperone present Cervical Os open Products of conception in vaginal vault  Musculoskeletal: Normal range of motion. She exhibits no edema.  Lymphadenopathy:       Right: No inguinal adenopathy present.       Left: No inguinal adenopathy present.  Neurological: She is alert.  Speech is clear and goal oriented Moves extremities without ataxia  Skin: Skin is warm and dry. She is not diaphoretic. No erythema.  Psychiatric: She has a normal mood and affect.  Nursing note and vitals reviewed.    ED Treatments / Results  Labs (all labs ordered are listed, but only abnormal results are displayed) Labs Reviewed  WET PREP, GENITAL - Abnormal; Notable for the following components:      Result Value   WBC, Wet Prep HPF POC MODERATE (*)    All other components within normal limits  COMPREHENSIVE METABOLIC PANEL - Abnormal; Notable for the following components:   Potassium 3.1 (*)    Glucose, Bld 122 (*)    BUN <5 (*)    Calcium 8.8 (*)    All other components within normal limits  CBC - Abnormal; Notable for the following components:    RBC 3.84 (*)    Hemoglobin 11.3 (*)    HCT 34.3 (*)    All other components within normal limits  HCG, QUANTITATIVE, PREGNANCY - Abnormal; Notable for the following components:   hCG, Beta Chain, Quant, S 2,748 (*)    All other components within normal limits  URINALYSIS, ROUTINE W REFLEX MICROSCOPIC - Abnormal; Notable for the following components:   Color, Urine RED (*)    APPearance CLOUDY (*)    Glucose, UA   (*)    Value: TEST NOT REPORTED DUE TO COLOR INTERFERENCE OF URINE PIGMENT   Hgb urine dipstick   (*)    Value: TEST NOT REPORTED DUE TO COLOR INTERFERENCE OF URINE PIGMENT   Bilirubin Urine   (*)    Value: TEST NOT REPORTED DUE TO COLOR INTERFERENCE OF URINE PIGMENT   Ketones, ur   (*)    Value: TEST NOT REPORTED DUE TO COLOR INTERFERENCE OF URINE PIGMENT   Protein, ur   (*)    Value: TEST NOT REPORTED DUE  TO COLOR INTERFERENCE OF URINE PIGMENT   Nitrite   (*)    Value: TEST NOT REPORTED DUE TO COLOR INTERFERENCE OF URINE PIGMENT   Leukocytes, UA   (*)    Value: TEST NOT REPORTED DUE TO COLOR INTERFERENCE OF URINE PIGMENT   All other components within normal limits  URINALYSIS, ROUTINE W REFLEX MICROSCOPIC  GC/CHLAMYDIA PROBE AMP ([place]) NOT AT [facility]  SURGICAL PATHOLOGY    Radiology US Ob Comp < 14 Wks  Result Date: 01/31/2018 CLINICAL DATA:  37 year old pregnant female with vaginal bleeding. LMP: 11/09/2017 corresponding to an estimated gestational age of [redacted] weeks, 6 days. EXAM: OBSTETRIC <14 WK [place] AND TRANSVAGINAL OB US TECHNIQUE: Both transabdominal and transvaginal ultrasound examinations were performed for complete evaluation of the gestation as well as the maternal uterus, adnexal regions, and pelvic cul-de-sac. Transvaginal technique was performed to assess early pregnancy. COMPARISON:  None. FINDINGS: The uterus is anteverted. The endometrium is thickened and heterogeneous measuring 14 mm. No intrauterine pregnancy identified. Heterogeneous content in the  endometrium is suspicious for proteinaceous debris or blood products. There is an area of vascularity to the upper endometrium. Findings consistent with pregnancy of unknown location with differentials include an early pregnancy, recent spontaneous abortion, or an ectopic pregnancy. However, the constellation of findings is concerning for recent spontaneous abortion. Correlation with clinical exam and serial HCG levels and follow-up with ultrasound recommended. The ovaries appear unremarkable. The right ovary measures 2.7 x 1.9 x 1.8 cm and the left ovary measures 2.4 x 1.8 x 2.5 cm. No free fluid noted within the pelvis. IMPRESSION: No intrauterine pregnancy identified. Findings may represent an early pregnancy, recent spontaneous abortion, or ectopic pregnancy. The constellation of findings is however concerning for recent abortion. Clinical correlation and follow-up with serial HCG levels and ultrasound recommended. Heterogeneous content within the endometrium likely represents proteinaceous material or blood product. An area of vascularity to the upper endometrium noted and therefore retained products of conception is not entirely excluded. No free fluid within pelvis. Electronically Signed   By: Elgie Collard M.D.   On: 01/31/2018 05:02   US Ob Transvaginal  Result Date: 01/31/2018 CLINICAL DATA:  37 year old pregnant female with vaginal bleeding. LMP: 11/09/2017 corresponding to an estimated gestational age of [redacted] weeks, 6 days. EXAM: OBSTETRIC <14 WK [place] AND TRANSVAGINAL OB US TECHNIQUE: Both transabdominal and transvaginal ultrasound examinations were performed for complete evaluation of the gestation as well as the maternal uterus, adnexal regions, and pelvic cul-de-sac. Transvaginal technique was performed to assess early pregnancy. COMPARISON:  None. FINDINGS: The uterus is anteverted. The endometrium is thickened and heterogeneous measuring 14 mm. No intrauterine pregnancy identified. Heterogeneous  content in the endometrium is suspicious for proteinaceous debris or blood products. There is an area of vascularity to the upper endometrium. Findings consistent with pregnancy of unknown location with differentials include an early pregnancy, recent spontaneous abortion, or an ectopic pregnancy. However, the constellation of findings is concerning for recent spontaneous abortion. Correlation with clinical exam and serial HCG levels and follow-up with ultrasound recommended. The ovaries appear unremarkable. The right ovary measures 2.7 x 1.9 x 1.8 cm and the left ovary measures 2.4 x 1.8 x 2.5 cm. No free fluid noted within the pelvis. IMPRESSION: No intrauterine pregnancy identified. Findings may represent an early pregnancy, recent spontaneous abortion, or ectopic pregnancy. The constellation of findings is however concerning for recent abortion. Clinical correlation and follow-up with serial HCG levels and ultrasound recommended. Heterogeneous content within the endometrium likely  represents proteinaceous material or blood product. An area of vascularity to the upper endometrium noted and therefore retained products of conception is not entirely excluded. No free fluid within pelvis. Electronically Signed   By: Elgie CollardArash  Radparvar M.D.   On: 01/31/2018 05:02    Procedures Procedures (including critical care time)  Medications Ordered in ED Medications  sodium chloride 0.9 % bolus 1,000 mL (1,000 mLs Intravenous New Bag/Given 01/31/18 0505)     Initial Impression / Assessment and Plan / ED Course  I have reviewed the triage vital signs and the nursing notes.  Pertinent labs & imaging results that were available during my care of the patient were reviewed by me and considered in my medical decision making (see chart for details).     Presents with lower abdominal pain and vaginal bleeding.  She is pregnant.  Pelvic exam with products of conception in the vaginal vault.  Cervical office is open.   Moderate amount of bleeding.  Ultrasound shows no intrauterine pregnancy and no evidence of ectopic pregnancy.  Heterogeneous content within the endometrium likely represents material blood product.  Patient is afebrile without tachycardia.  Mild anemia noted.  Her bleeding has slowed significantly since removal of the products of conception from her vaginal vault.  Products sent to pathology lab.  Repeat abdominal exam is benign, soft and nontender.  Patient needs 48-hour follow-up with OB/GYN for repeat quant and physical examination.  She is to return immediately to the emergency department for worsening bleeding, worsening pain, fevers, lightheadedness or near syncope.  She states understanding and is in agreement with this plan.    Final Clinical Impressions(s) / ED Diagnoses   Final diagnoses:  Incomplete miscarriage  Vaginal bleeding    ED Discharge Orders    None       Mardene SayerMuthersbaugh, Boyd KerbsHannah, PA-C 01/31/18 [id number]    Geoffery Lyonselo, Douglas, MD 01/31/18 [id number]

## 2018-02-01 LAB — GC/CHLAMYDIA PROBE AMP (~~LOC~~) NOT AT ARMC
CHLAMYDIA, DNA PROBE: NEGATIVE
NEISSERIA GONORRHEA: NEGATIVE

## 2019-01-01 LAB — CHG URINE PREGNANCY TEST: Preg Test, Ur: POSITIVE

## 2019-01-04 ENCOUNTER — Inpatient Hospital Stay (HOSPITAL_COMMUNITY)
Admission: AD | Admit: 2019-01-04 | Discharge: 2019-01-04 | Disposition: A | Discharge status: Home / Self Care | Payer: Self-pay | Attending: Obstetrics and Gynecology | Admitting: Obstetrics and Gynecology

## 2019-01-04 ENCOUNTER — Inpatient Hospital Stay (HOSPITAL_COMMUNITY): Payer: Self-pay

## 2019-01-04 ENCOUNTER — Other Ambulatory Visit: Payer: Self-pay

## 2019-01-04 ENCOUNTER — Encounter (HOSPITAL_COMMUNITY): Payer: Self-pay | Admitting: *Deleted

## 2019-01-04 DIAGNOSIS — O26899 Other specified pregnancy related conditions, unspecified trimester: Secondary | ICD-10-CM

## 2019-01-04 DIAGNOSIS — O26891 Other specified pregnancy related conditions, first trimester: Secondary | ICD-10-CM

## 2019-01-04 DIAGNOSIS — R109 Unspecified abdominal pain: Secondary | ICD-10-CM | POA: Insufficient documentation

## 2019-01-04 DIAGNOSIS — O4691 Antepartum hemorrhage, unspecified, first trimester: Secondary | ICD-10-CM

## 2019-01-04 DIAGNOSIS — O4690 Antepartum hemorrhage, unspecified, unspecified trimester: Secondary | ICD-10-CM

## 2019-01-04 DIAGNOSIS — O209 Hemorrhage in early pregnancy, unspecified: Secondary | ICD-10-CM | POA: Insufficient documentation

## 2019-01-04 DIAGNOSIS — Z3A01 Less than 8 weeks gestation of pregnancy: Secondary | ICD-10-CM | POA: Insufficient documentation

## 2019-01-04 DIAGNOSIS — O3680X0 Pregnancy with inconclusive fetal viability, not applicable or unspecified: Secondary | ICD-10-CM

## 2019-01-04 HISTORY — DX: High grade squamous intraepithelial lesion on cytologic smear of cervix (HGSIL): R87.613

## 2019-01-04 LAB — URINALYSIS, ROUTINE W REFLEX MICROSCOPIC
BACTERIA UA: NONE SEEN
BILIRUBIN URINE: NEGATIVE
Glucose, UA: NEGATIVE mg/dL
Ketones, ur: NEGATIVE mg/dL
LEUKOCYTES UA: NEGATIVE
Nitrite: NEGATIVE
Protein, ur: NEGATIVE mg/dL
SPECIFIC GRAVITY, URINE: 1.023 (ref 1.005–1.030)
pH: 5 (ref 5.0–8.0)

## 2019-01-04 LAB — CBC
HCT: 33.3 % — ABNORMAL LOW (ref 36.0–46.0)
HEMOGLOBIN: 10.6 g/dL — AB (ref 12.0–15.0)
MCH: 27.2 pg (ref 26.0–34.0)
MCHC: 31.8 g/dL (ref 30.0–36.0)
MCV: 85.4 fL (ref 80.0–100.0)
PLATELETS: 357 10*3/uL (ref 150–400)
RBC: 3.9 MIL/uL (ref 3.87–5.11)
RDW: 15.4 % (ref 11.5–15.5)
WBC: 6.7 10*3/uL (ref 4.0–10.5)
nRBC: 0 % (ref 0.0–0.2)

## 2019-01-04 LAB — HCG, QUANTITATIVE, PREGNANCY: hCG, Beta Chain, Quant, S: 1995 m[IU]/mL — ABNORMAL HIGH (ref <5)

## 2019-01-04 LAB — WET PREP, GENITAL
Clue Cells Wet Prep HPF POC: NONE SEEN
SPERM: NONE SEEN
TRICH WET PREP: NONE SEEN
YEAST WET PREP: NONE SEEN

## 2019-01-04 LAB — POCT PREGNANCY, URINE: PREG TEST UR: POSITIVE — AB

## 2019-01-04 LAB — ABO/RH: ABO/RH(D): O POS

## 2019-01-04 NOTE — MAU Note (Signed)
Pt states she started bleeding on Jan 14, was light, became heavier last night.    Was expecting period on Jan 9.  Lower abdominal pain x 3 days.

## 2019-01-04 NOTE — MAU Provider Note (Addendum)
History     CSN: [id number]  Arrival date and time: 01/04/19 [id number]   First Provider Initiated Contact with Patient 01/04/19 1035      Chief Complaint  Patient presents with  . Vaginal Bleeding  . Abdominal Pain   Kristi Nguyen is a 38 y.o. B2W4132G5P3013 female presenting for possible miscarriage. She has not been on birth control for 1739yrs. She reports LMP of 11/15/18 with light spotting that began 12/21/2018 and progressed to heavier bleeding yesterday and overnight. She reports some mild cramping, she has not tried anything and does not request anything for pain. 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 HGSIL (high grade squamous intraepithelial lesion) on Pap smear of cervix 2014    Past Surgical History:  Procedure Laterality Date  . CESAREAN SECTION    . LEEP      No family history on file.  Social History   Tobacco Use  . Smoking status: Never Smoker  . Smokeless tobacco: Never Used  Substance Use Topics  . Alcohol use: No  . Drug use: No    Allergies: No Known Allergies  Medications Prior to Admission  Medication Sig Dispense Refill Last Dose  . guaifenesin (ROBITUSSIN) 100 MG/5ML syrup Take 200 mg by mouth 3 (three) times daily as needed for cough.   01/30/2018 at Unknown time    Review of Systems  Constitutional: Negative.  Negative for fever.  HENT: Negative.   Eyes: Negative.   Respiratory: Negative.  Negative for cough and shortness of breath.   Cardiovascular: Negative.  Negative for chest pain.  Gastrointestinal: Negative.  Negative for abdominal distention, abdominal pain, constipation, diarrhea, nausea and vomiting.  Endocrine: Negative.   Genitourinary: Positive for pelvic pain (mild  cramping) and vaginal bleeding. Negative for difficulty urinating, dysuria and flank pain.  Musculoskeletal: Negative.   Skin: Negative.   Allergic/Immunologic: Negative.   Neurological: Negative.  Negative for dizziness.  Hematological: Negative.   Psychiatric/Behavioral: Negative.    Physical Exam   Blood pressure 119/77, pulse 98, temperature 98.5 F (36.9 C), temperature source Oral, resp. rate 18, weight 72.1 kg, last menstrual period 11/15/2018.  Physical Exam  Nursing note and vitals reviewed. Constitutional: She is oriented to person, place, and time. She appears well-developed and well-nourished.  Cardiovascular: Normal rate.  Respiratory: Effort normal.  GI: Soft.  Genitourinary:    Uterus normal.     Pelvic exam was performed with patient supine.     Vaginal bleeding (Moderate bleeding coming from cervical os) present.  There is bleeding (Moderate bleeding coming from cervical os) in the vagina.  Neurological: She is alert and oriented to person, place, and time.  Skin: Skin is warm and dry. No rash noted. She is not diaphoretic.  Psychiatric: She has a normal mood and affect. Judgment and thought content normal.  Patient very fidgety and difficult to calm for procedure. Tolerated well as long as Veryl Speaktta stood nearby reminding her to relax, and I moved slowly  and talked through each step.   MAU Course  Procedures  MDM GC/CT, wet prep, HIV Quantitative Hcg CBC, ABO/Rh TVUS  Assessment and Plan  IUP of unknown location - discharge to home with  Repeat quantitative Hcg in [facility] at 8:30am on Thursday, 01/06/2019  Bernerd Limbo, SNM 01/04/2019, 11:19 AM   CNM attestation:  I have seen and examined this patient and agree with above documentation in the SNM's note.   Kristi Nguyen is a 38 y.o. [id number] female reporting VB since 12/21/2018 that started out light, but became heavier on 01/03/2019. Her LMP was 11/15/2018. Her expected period was 12/16/2018.  She has a h/o SAB in 01/2018. She is afraid that she is having a miscarriage, because the bleeding is the same amount.  Associated symptoms:  Denies fever and chills (+) abdominal pain x 3 days (+) vaginal bleeding x 14 days -- started spotting now heavy since 01/03/2019 Denies vaginal discharge Denies urinary complaints Denies GI complaints  PE: Patient Vitals for the past 24 hrs:  BP Temp Temp src Pulse Resp Weight  01/04/19 1219 130/80 - - 80 16 -  01/04/19 1000 119/77 98.5 F (36.9 C) Oral 98 18 159 lb (72.1 kg)   Gen: calm comfortable, NAD Resp: normal effort, no distress Heart: Regular rate Abd: Soft, NT, Pos BS x 4 Neuro: A&O x 4 Pelvic exam: NEFG, Physiologic/moderate amount of thin, white, malodorous/moderate amount of thick, white, curd-like, odorless/moderate amount of mucopurulent discharge, no vaginal bleeding. Uterus normal size, no adnexal masses or tenderness. No cervical motion tenderness.  ROS, labs, PMH reviewed  Orders Placed This Encounter  Procedures  . Wet prep, genital  . US OB LESS THAN 14 WEEKS WITH OB TRANSVAGINAL  . Urinalysis, Routine w reflex microscopic  . CBC  . hCG, quantitative, pregnancy  . HIV Antibody (routine testing w rflx)  . Pregnancy, urine POC  . ABO/Rh  . Discharge patient    MDM [facility] UPT CBC ABO/Rh HCG Wet Prep GC/CT -- pending HIV -- pending OB < 14 wks [place] with TV  Results for orders placed or performed during the hospital encounter of 01/04/19 (from the past 24 hour(s))  Urinalysis, Routine w reflex microscopic     Status: Abnormal   Collection Time: 01/04/19 10:16 AM  Result Value Ref Range   Color, Urine YELLOW YELLOW   APPearance HAZY (A) CLEAR   Specific Gravity, Urine 1.023 1.005 - 1.030   pH 5.0 5.0 - 8.0   Glucose, UA NEGATIVE NEGATIVE mg/dL   Hgb urine dipstick LARGE (A) NEGATIVE   Bilirubin Urine NEGATIVE NEGATIVE   Ketones, ur NEGATIVE NEGATIVE mg/dL   Protein, ur NEGATIVE NEGATIVE mg/dL   Nitrite  NEGATIVE NEGATIVE   Leukocytes, UA NEGATIVE NEGATIVE   RBC / HPF 21-50 0 - 5 RBC/hpf   WBC, UA 6-10 0 - 5 WBC/hpf   Bacteria, UA NONE SEEN NONE SEEN   Squamous Epithelial / LPF 6-10 0 - 5   Mucus PRESENT   Pregnancy, urine POC     Status: Abnormal   Collection Time: 01/04/19 10:18 AM  Result Value Ref Range   Preg Test, Ur POSITIVE (A) NEGATIVE  CBC     Status: Abnormal   Collection Time: 01/04/19 10:43 AM  Result Value Ref Range   WBC 6.7 4.0 - 10.5 K/uL   RBC 3.90 3.87 - 5.11 MIL/uL   Hemoglobin 10.6 (L) 12.0 - 15.0 g/dL   HCT 29.5 (L) 62.1 - 30.8 %  MCV 85.4 80.0 - 100.0 fL   MCH 27.2 26.0 - 34.0 pg   MCHC 31.8 30.0 - 36.0 g/dL   RDW 85.6 31.4 - 97.0 %   Platelets 357 150 - 400 K/uL   nRBC 0.0 0.0 - 0.2 %  ABO/Rh     Status: None (Preliminary result)   Collection Time: 01/04/19 10:43 AM  Result Value Ref Range   ABO/RH(D)      O POS Performed at [facility], [address] [place], [place] [address]   hCG, quantitative, pregnancy     Status: Abnormal   Collection Time: 01/04/19 10:43 AM  Result Value Ref Range   hCG, Beta Chain, Quant, S 1,995 (H) <5 mIU/mL  Wet prep, genital     Status: Abnormal   Collection Time: 01/04/19 10:51 AM  Result Value Ref Range   Yeast Wet Prep HPF POC NONE SEEN NONE SEEN   Trich, Wet Prep NONE SEEN NONE SEEN   Clue Cells Wet Prep HPF POC NONE SEEN NONE SEEN   WBC, Wet Prep HPF POC FEW (A) NONE SEEN   Sperm NONE SEEN     US Ob Less Than 14 Weeks With Ob Transvaginal  Result Date: 01/04/2019 CLINICAL DATA:  Abdominal pain and vaginal bleeding in pregnancy. Gestational age by LMP of 7 weeks 1 day. EXAM: OBSTETRIC <14 WK [place] AND TRANSVAGINAL OB US TECHNIQUE: Both transabdominal and transvaginal ultrasound examinations were performed for complete evaluation of the gestation as well as the maternal uterus, adnexal regions, and pelvic cul-de-sac. Transvaginal technique was performed to assess early pregnancy. COMPARISON:  None. FINDINGS:  Intrauterine gestational sac: None Maternal uterus/adnexae: Both ovaries are normal in appearance. No mass or abnormal free fluid identified. IMPRESSION: Pregnancy of unknown anatomic location (no intrauterine gestational sac or adnexal mass identified). Differential diagnosis includes recent spontaneous abortion, IUP too early to visualize, and non-visualized ectopic pregnancy. Recommend correlation with serial beta-hCG levels, and follow up US if warranted clinically. Electronically Signed   By: Myles Rosenthal M.D.   On: 01/04/2019 11:37     Assessment 1. Vaginal bleeding in pregnancy, first trimester   2. Abdominal pain in pregnancy   3. Vaginal bleeding in pregnancy   4. Pregnancy of unknown anatomic location     Plan: - Discharge home in stable condition. - Threatened miscarriage, VB in pregnancy and ectopic pregnancy precautions. - Follow-up with [facility] on Thursday 1/30/2020at 0830 or sooner as needed if symptoms worsen. - Return to maternity admissions symptoms worsen  I personally was present during the history, physical exam, and medical decision-making activities of this service and have verified that the service and findings are accurately documented in the student's note.  Kristi Nguyen, CNM 01/04/2019 1:16 PM

## 2019-01-04 NOTE — Discharge Instructions (Signed)
Return to Maternity Admissions for:  If you have heavier bleeding that soaks through more that 2 pads per hour for an hour or more  If you bleed so much that you feel like you might pass out or you do pass out  If you have significant abdominal pain that is not improved with Tylenol   If you develop a fever > 100.5

## 2019-01-05 LAB — GC/CHLAMYDIA PROBE AMP (~~LOC~~) NOT AT ARMC
CHLAMYDIA, DNA PROBE: NEGATIVE
Neisseria Gonorrhea: NEGATIVE

## 2019-01-05 LAB — HIV ANTIBODY (ROUTINE TESTING W REFLEX): HIV Screen 4th Generation wRfx: NONREACTIVE

## 2019-01-06 ENCOUNTER — Ambulatory Visit (INDEPENDENT_AMBULATORY_CARE_PROVIDER_SITE_OTHER): Payer: Self-pay | Admitting: *Deleted

## 2019-01-06 DIAGNOSIS — O039 Complete or unspecified spontaneous abortion without complication: Secondary | ICD-10-CM

## 2019-01-06 DIAGNOSIS — O3680X0 Pregnancy with inconclusive fetal viability, not applicable or unspecified: Secondary | ICD-10-CM

## 2019-01-06 LAB — HCG, QUANTITATIVE, PREGNANCY: HCG, BETA CHAIN, QUANT, S: 1689 m[IU]/mL — AB (ref <5)

## 2019-01-06 MED ORDER — MISOPROSTOL 200 MCG PO TABS: ORAL_TABLET | ORAL | 0 refills | Status: DC

## 2019-01-06 NOTE — Progress Notes (Addendum)
Here for stat bhcg. Denies pain. C/o spotting only when she goes to the restroom. Explained we will draw stat bhcg and have her wait in lobby for results which we will then discuss with provider and then her. She voices understanding. Used Interpreter [employer].  Linda,RN Discussed results with Dr. Erin Fulling and instructed to inform patient that it appears she is having a miscarriage due to the drop in her hormone level. Recommend cytotec 4 tablets per vagina and see provider in one week. Informed patient with Interpreter Vennie Homans as above. She voices understanding.  Jennette Banker of Attending Supervision of RN: Evaluation and management procedures were performed by the nurse under my supervision and collaboration.  I have reviewed the nursing note and chart, and I agree with the management and plan.  Carolyn L. Harraway-Smith, M.D., [facility] [place]

## 2019-01-19 ENCOUNTER — Ambulatory Visit (INDEPENDENT_AMBULATORY_CARE_PROVIDER_SITE_OTHER): Payer: Self-pay | Admitting: Medical

## 2019-01-19 ENCOUNTER — Encounter: Payer: Self-pay | Admitting: Medical

## 2019-01-19 VITALS — BP 130/82 | HR 81 | Ht 63.0 in | Wt 161.3 lb

## 2019-01-19 DIAGNOSIS — O039 Complete or unspecified spontaneous abortion without complication: Secondary | ICD-10-CM

## 2019-01-19 NOTE — Progress Notes (Signed)
  History:  [name] is a 38 y.o. [phone] who presents to clinic today for follow-up after SAB. She was first seen on 1/28 for bleeding in MAU. PUL was diagnosed. Patient returned to [facility] on 1/30 and hCG levels had decreased from 1995 to 1689. She was given Cytotec which she took that night. She states she did not bleed that much the next day, but did have to use a pad. She is still spotting today. She denies pain or fever.    The following portions of the patient's history were reviewed and updated as appropriate: allergies, current medications, family history, past medical history, social history, past surgical history and problem list.  Review of Systems:  Review of Systems  Constitutional: Negative for fever.  Gastrointestinal: Negative for abdominal pain.  Genitourinary:       + spotting      Objective:  Physical Exam BP 130/82   Pulse 81   Ht 5\' 3"  (1.6 m)   Wt 161 lb 4.8 oz (73.2 kg)   LMP 11/15/2018 Comment: SAB  Breastfeeding Unknown   BMI 28.57 kg/m  Physical Exam  Nursing note and vitals reviewed. Constitutional: She is oriented to person, place, and time. She appears well-developed and well-nourished. No distress.  HENT:  Head: Normocephalic and atraumatic.  Cardiovascular: Normal rate.  Respiratory: Effort normal.  GI: Soft. She exhibits no distension and no mass. There is no abdominal tenderness. There is no rebound and no guarding.  Neurological: She is alert and oriented to person, place, and time.  Skin: Skin is warm and dry. No erythema.  Psychiatric: She has a normal mood and affect.    Assessment & Plan:  1. SAB (spontaneous abortion) - Beta hCG quant (ref lab) - Depending on results of hCG patient may require [place] due to minimal bleeding following Cytotec, if hCG has declined, but is not normal for non-pregnant will repeat in 1 week   Marny Lowenstein, PA-C 01/19/2019 9:25 AM

## 2019-01-19 NOTE — Patient Instructions (Signed)
Aborto espontneo Miscarriage El aborto espontneo es la prdida de un beb que no ha nacido (feto) antes de la semana20 del embarazo. Siga estas indicaciones en su casa: Medicamentos   Tome los medicamentos de venta libre y los recetados solamente como se lo haya indicado el mdico.  Si le recetaron un antibitico, tmelo como se lo haya indicado el mdico. No deje de tomar los antibiticos aunque comience a sentirse mejor.  No tome antiinflamatorios no esteroideos (AINE), a menos que el mdico le diga que son seguros para usted. Estos incluyen aspirina e ibuprofeno. Estos medicamentos pueden provocarle sangrado. Actividad  Haga reposo segn lo indicado. Pregntele al mdico qu actividades son seguras para usted.  Pida ayuda para realizar las tareas de la casa durante este tiempo. Instrucciones generales  Anote cuntos apsitos usa por da y cun saturados estn.  Observe la cantidad de tejido o grumos de sangre (cogulos de sangre) que expulsa por la vagina. Guarde las cantidades grandes de tejido para llevrselas al mdico.  No use tampones, no se haga duchas vaginales ni tenga relaciones sexuales hasta que el mdico la autorice.  Para que usted y su pareja puedan sobrellevar el proceso de duelo, hable con su mdico o busque apoyo psicolgico.  Cuando est lista, acuda al mdico para hablar sobre los pasos que debe seguir para cuidar su salud. Adems, hable con su mdico sobre las medidas que debe adoptar para tener un embarazo saludable en el futuro.  Concurra a todas las visitas de seguimiento como se lo haya indicado el mdico. Esto es importante. Comunquese con un mdico si:  Tiene fiebre o siente escalofros.  Tiene una secrecin vaginal con mal olor.  Aumenta el sangrado. Solicite ayuda de inmediato si:  Tiene espasmos o dolor muy intensos en el abdomen o en la espalda.  Elimina grumos de sangre por la vagina, que tienen el tamao de una nuez o ms.  Elimina  tejido por la vagina, que tiene el tamao de una nuez o ms.  Empapa ms de un apsito de tamao normal por hora.  Se siente dbil o mareada.  Pierde el conocimiento (se desmaya).  Siente tristeza que no se va o piensa en lastimarse. Resumen  El aborto espontneo es la prdida de un beb que no ha nacido antes de la semana20 del embarazo.  Siga las indicaciones de su mdico para el cuidado en su hogar. Concurra a todas las visitas de control.  Para que usted y su pareja puedan sobrellevar el proceso de duelo, hable con su mdico o busque apoyo psicolgico. Esta informacin no tiene como fin reemplazar el consejo del mdico. Asegrese de hacerle al mdico cualquier pregunta que tenga. Document Released: 05/25/2012 Document Revised: 08/31/2017 Document Reviewed: 08/31/2017 Elsevier Interactive Patient Education  2019 Elsevier Inc.  

## 2019-01-19 NOTE — Progress Notes (Signed)
Pt states is still bleeding, only sees it when she wipes.

## 2019-01-20 ENCOUNTER — Telehealth: Payer: Self-pay | Admitting: Medical

## 2019-01-20 LAB — BETA HCG QUANT (REF LAB): HCG QUANT: 621 m[IU]/mL

## 2019-01-20 NOTE — Telephone Encounter (Signed)
Called patient using pacific interpreters and LM to [facility]. Quant hCG is still elevated and as long as patient is still without pain she can be given the option to either follow-up for repeat labs in 1 week OR come to MAU for MTX because her hCG levels are not falling as quickly as we would expect.   Marny LowensteinWenzel, Idil Maslanka N, PA-C 01/20/2019 12:03 PM

## 2019-01-24 ENCOUNTER — Telehealth: Payer: Self-pay | Admitting: Internal Medicine

## 2019-01-24 NOTE — Telephone Encounter (Signed)
Patient called in regards to test results and to make an follow up HCG appointment.

## 2019-01-25 ENCOUNTER — Telehealth: Payer: Self-pay

## 2019-01-25 NOTE — Telephone Encounter (Signed)
Per chart review from Vonzella Nipple, PA note pt "Quant hCG is still elevated and as long as patient is still without pain she can be given the option to either follow-up for repeat labs in 1 week OR come to MAU for MTX because her hCG levels are not falling as quickly as we would expect".   LM for pt with Luna Kitchens, CNM stating to please call the office and that if she is having any pain to go to MAU.

## 2019-01-27 ENCOUNTER — Other Ambulatory Visit: Payer: Self-pay

## 2019-01-27 DIAGNOSIS — O3680X0 Pregnancy with inconclusive fetal viability, not applicable or unspecified: Secondary | ICD-10-CM

## 2019-01-27 NOTE — Telephone Encounter (Signed)
Per chart came in this am for non stat bhcg.  Called patient with [facility] [phone] and she denies pain; states she only has spotting when she goes to bathroom; otherwise none. I explained we will call her with results in a few days and she should call us if she does not get a call. I also instructed her to go to MAU if she has severe pain. She voices understanding.

## 2019-01-28 LAB — BETA HCG QUANT (REF LAB): hCG Quant: 301 m[IU]/mL

## 2019-02-01 ENCOUNTER — Telehealth: Payer: Self-pay

## 2019-02-01 NOTE — Telephone Encounter (Addendum)
-----   Message from Marny Lowenstein, PA-C sent at 01/28/2019  8:34 AM EST ----- Please inform patient that hCG has continued to drop but is still elevated. As long as she does not have severe pain we can follow-up with a non-stat repeat hCG in 1 week. Strict ectopic precautions.   Kathlene Cote 01/28/2019 8:34 AM  Notified pt with Luna Kitchens, CNM recommendation by Raynelle Fanning.  Pt states that she already has an appt scheduled on 02/03/19 at 0850.  Pt did not have any other questions.

## 2019-02-02 ENCOUNTER — Other Ambulatory Visit: Payer: Self-pay | Admitting: *Deleted

## 2019-02-02 DIAGNOSIS — O039 Complete or unspecified spontaneous abortion without complication: Secondary | ICD-10-CM

## 2019-02-03 ENCOUNTER — Other Ambulatory Visit: Payer: Self-pay

## 2019-02-03 DIAGNOSIS — O039 Complete or unspecified spontaneous abortion without complication: Secondary | ICD-10-CM

## 2019-02-04 LAB — BETA HCG QUANT (REF LAB): hCG Quant: 82 m[IU]/mL

## 2019-02-07 ENCOUNTER — Telehealth: Payer: Self-pay | Admitting: *Deleted

## 2019-02-07 NOTE — Telephone Encounter (Signed)
Called Kristi Nguyen with interpreter Lorinda Creed and notified her of results and reccommendations per Sherrye Payor. She voices understanding. She denies pain, and c/o light bleeding only. She already has lab appt for 02/10/19 which she states she will keep.

## 2019-02-07 NOTE — Telephone Encounter (Signed)
-----   Message from Marny Lowenstein, PA-C sent at 02/04/2019 11:19 AM EST ----- Quant hCG continues to fall. As long as patient remains without severe pain or heavy bleeding she can follow-up for non-Stat hCG in 1 week.   Thanks,  Raynelle Fanning

## 2019-02-10 ENCOUNTER — Other Ambulatory Visit: Payer: Self-pay

## 2019-02-10 DIAGNOSIS — O039 Complete or unspecified spontaneous abortion without complication: Secondary | ICD-10-CM

## 2019-02-11 LAB — BETA HCG QUANT (REF LAB): hCG Quant: 15 m[IU]/mL

## 2019-02-15 ENCOUNTER — Other Ambulatory Visit: Payer: Self-pay | Admitting: *Deleted

## 2019-02-15 ENCOUNTER — Telehealth: Payer: Self-pay

## 2019-02-15 DIAGNOSIS — O039 Complete or unspecified spontaneous abortion without complication: Secondary | ICD-10-CM

## 2019-02-15 NOTE — Telephone Encounter (Addendum)
-----   Message from Marny Lowenstein, PA-C sent at 02/11/2019 12:14 PM EST ----- Continues to fall, but not normal. Repeat in 1 week as long as no severe pain or heavy bleeding.   Thanks,  Butler Denmark pt with Spanish Interpreter Eda R.  Notified pt providers recommendation and verified with pt if she is having any pain or bleeding.  Pt reports having scant bleeding.  Pt states that she will be able to come 02/17/19 btwn [phone] for non-stat beta level. Pt verbalized understanding with no further questions.

## 2019-02-17 ENCOUNTER — Other Ambulatory Visit: Payer: Self-pay

## 2019-02-17 ENCOUNTER — Encounter: Payer: Self-pay | Admitting: *Deleted

## 2019-02-17 ENCOUNTER — Telehealth: Payer: Self-pay | Admitting: Obstetrics and Gynecology

## 2019-02-17 DIAGNOSIS — O039 Complete or unspecified spontaneous abortion without complication: Secondary | ICD-10-CM

## 2019-02-17 NOTE — Telephone Encounter (Signed)
The patient was unable to sign in due to the system being down.

## 2019-02-18 ENCOUNTER — Telehealth: Payer: Self-pay

## 2019-02-18 LAB — BETA HCG QUANT (REF LAB): HCG QUANT: 3 m[IU]/mL

## 2019-02-18 NOTE — Telephone Encounter (Addendum)
-----   Message from Marny Lowenstein, PA-C sent at 02/18/2019 10:35 AM EDT ----- Kristi Nguyen hCG is normal now. Please inform patient. No additional lab draws needed. She should see a provider in ~ 2 weeks for follow-up.   Marny Lowenstein, PA-C 02/18/2019 10:35 AM  Appt scheduled for March 31 @ 1015.  With Spanish interpreter [name] pt informed providers recommendation and appt. I also informed pt that we will be in our new location on the first floor. Pt stated understanding.

## 2019-02-28 ENCOUNTER — Encounter: Payer: Self-pay | Admitting: *Deleted

## 2019-03-07 NOTE — Progress Notes (Signed)
  Subjective:     Patient ID: Kristi Nguyen, female   DOB: [birth year], 38 y.o.   MRN: [id number]  Telehealth Visit conducted due to COVID-19 Pandemic.  HPI: F/U SAB. Quant 3 on 02/17/19. No Complaints. Requesting contraception.   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 F/U Pap 08/2013 Nm w/ neg HPV.   Live Interpreter used  Review of Systems Denies vaginal bleeding or abdominal pain.  Coping well emotionally      Past Medical History:  Diagnosis Date  . HGSIL (high grade squamous intraepithelial lesion) on Pap smear of cervix 2014  Denies Hx clotting disorder, HTN, MI, VDA, PE, DVT, or smoking.    The patient has a family history of Social History   Socioeconomic History  . Marital status: Married    Spouse name: Not on file  . Number of children: Not on file  . Years of education: Not on file  . Highest education level: Not on file  Occupational History  . Not on file  Social Needs  . Financial resource strain: Not on file  . Food insecurity:    Worry: Never true    Inability: Sometimes true  . Transportation needs:    Medical: Yes    Non-medical: Yes  Tobacco Use  . Smoking status: Never Smoker  . Smokeless tobacco: Never Used  Substance and Sexual Activity  . Alcohol use: No  . Drug use: No  . Sexual activity: Yes    Birth control/protection: None  Lifestyle  . Physical activity:    Days per week: Not on file    Minutes per session: Not on file  . Stress: Not on file  Relationships  . Social connections:    Talks on phone: Not on file    Gets together: Not on file    Attends religious service: Not on file    Active member of club or organization: Not on file    Attends meetings of clubs or organizations: Not on file    Relationship status: Not on file  . Intimate partner violence:    Fear of current or ex partner: Not on file    Emotionally abused: Not on file    Physically abused: Not on file    Forced sexual activity: Not on file  Other Topics Concern  .  Not on file  Social History Narrative  . Not on file    Objective:   Physical Exam Deferred due telehealth visit.     Assessment:     1. FU SAB--Doing well - Offered IBH visit PRN-declined.   2. Initiation of oral contraception - norgestimate-ethinyl estradiol (ORTHO-CYCLEN,SPRINTEC,PREVIFEM) 0.25-35 MG-MCG tablet; Take 1 tablet by mouth daily.  Dispense: 1 Package; Refill: 12 - Discussed LARC in the future for for effective contraception.     Plan:    Support given  Pap due in 3-6 months. [facility]CP number given and in-basket message sent.  OCP Rx.   Katrinka Blazing, [place], CNM 03/07/2019 6:01 PM

## 2019-03-08 ENCOUNTER — Encounter: Payer: Self-pay | Admitting: Advanced Practice Midwife

## 2019-03-08 ENCOUNTER — Ambulatory Visit (INDEPENDENT_AMBULATORY_CARE_PROVIDER_SITE_OTHER): Payer: Self-pay | Admitting: Advanced Practice Midwife

## 2019-03-08 ENCOUNTER — Other Ambulatory Visit: Payer: Self-pay

## 2019-03-08 DIAGNOSIS — Z5189 Encounter for other specified aftercare: Secondary | ICD-10-CM

## 2019-03-08 DIAGNOSIS — O039 Complete or unspecified spontaneous abortion without complication: Secondary | ICD-10-CM

## 2019-03-08 DIAGNOSIS — Z30011 Encounter for initial prescription of contraceptive pills: Secondary | ICD-10-CM

## 2019-03-08 MED ORDER — NORGESTIMATE-ETH ESTRADIOL 0.25-35 MG-MCG PO TABS: 1.0000 | ORAL_TABLET | Freq: Every day | ORAL | 12 refills | Status: DC

## 2019-03-08 NOTE — Progress Notes (Signed)
I connected with  Kristi Nguyen on 03/08/19 at 10:15 AM EDT by telephone and verified that I am speaking with the correct person using two identifiers. [employer] trader)   I discussed the limitations, risks, security and privacy concerns of performing an evaluation and management service by telephone and the availability of in person appointments. I also discussed with the patient that there may be a patient responsible charge related to this service. The patient expressed understanding and agreed to proceed.  Janene Madeira Sylvie Mifsud, CMA 03/08/2019  9:59 AM

## 2019-03-08 NOTE — Patient Instructions (Signed)
Información sobre los anticonceptivos orales  Oral Contraception Information  Los anticonceptivos orales (ACO) son medicamentos que se utilizan para evitar el embarazo. Los ACO se toma por vía oral y actúan de las siguientes maneras:  · Evitan que los ovarios liberen óvulos.  · Espesan la mucosidad en la parte inferior del útero (cuello uterino), lo cual impide que los espermatozoides ingresen en el útero.  · Adelgazan el revestimiento del útero (endometrio), lo cual evita que un óvulo fertilizado se adhiera al endometrio.  Los ACO son muy efectivos cuando se toman exactamente como se indica. Sin embargo, no evitan las infección de transmisión sexual (ITS). La práctica del sexo seguro, como el uso de preservativos junto con un ACO, puede ayudar a prevenir las ITS.  Antes de comenzar a tomar un ACO  Antes de comenzar a tomar un ACO, debe hacerse un examen físico, análisis de sangre y una prueba de Papanicolaou. Sin embargo, no es necesario que se haga un examen pélvico para que le receten un ACO. El médico se asegurará de que usted sea una buena candidata para usar anticonceptivos orales. Los ACO no son una buena opción para ciertas mujeres, incluidas las mujeres que fuman y que son mayores de 35 años de edad y las mujeres con antecedentes médicos de hipertensión arterial, trombosis venosa profunda, embolia pulmonar, accidente cerebrovascular, enfermedad cardiovascular o enfermedad vascular periférica.  Converse con su médico acerca de los posibles efectos secundarios de los ACO que podrían recetarle. Cuando comienza el uso de un ACO, tenga en cuenta que su organismo puede tardar de 2 a 3 meses en adaptarse a los cambios en los niveles hormonales.  Siga las indicaciones de su médico acerca de cómo empezar a tomar el primer ciclo del ACO. En función de cuándo comienza a tomar la pastilla, es posible que deba usar un método anticonceptivo de respaldo, como preservativos, durante la primera semana. Asegúrese de saber  qué hacer si alguna vez se olvida de tomar la píldora.  Tipos de anticonceptivos orales    Los tipos más comunes de anticonceptivos orales contienen estrógeno y progestina (progesterona sintética), o solo progestina.  La píldora combinada  Este tipo de píldora contiene estrógeno y progestina. Las píldoras combinadas a menudo vienen en paquetes de 21, 28 o 91 píldoras. En cada paquete, las últimas 7 píldoras pueden no contener hormonas, lo que significa que puede dejar de tomar las píldoras durante 7 días. El sangrado menstrual ocurre durante la semana en que no toma las píldoras o que toma las píldoras que no contienen hormonas.  La minipíldora  Este tipo de píldora contiene progesterona solamente. Viene en paquetes de 28 píldoras. Las 28 píldoras contienen la hormona. Las píldoras deben tomarse todos los días. Es importante que las tome a la misma hora todos los días.  Ventajas de los anticonceptivos orales  · Proporcionan una anticoncepción confiable y continua si se toman según las indicaciones.  · Pueden tratar o disminuir los síntomas de lo siguiente:  ? Los cólicos de los períodos menstruales.  ? Ciclos menstruales o sangrado irregulares.  ? Flujo menstrual abundante.  ? Sangrado uterino anormal.  ? Acné, según el tipo de píldora.  ? Síndrome ovárico poliquístico.  ? Endometriosis.  ? Anemia por carencia de hierro.  ? Síntomas premenstruales, incluido el trastorno disfórico premenstrual.  · Pueden reducir el riesgo de cáncer endometrial y de ovario.  · Pueden usarse como anticonceptivo de emergencia.  · Evitan los embarazos extrauterinos (ectópicos) y las infecciones de las trompas de Falopio.    Factores que pueden hacer que los anticonceptivos orales sean menos efectivos  Pueden ser menos efectivos si:  · Se olvida de tomar la píldora todos los días a la misma hora. Esto es especialmente importante al tomar la minipíldora.  · Tiene una enfermedad estomacal o intestinal que reduce la capacidad del cuerpo para  absorber la píldora.  · Toma los ACO junto con otros medicamentos que los hacen menos efectivos, como antibióticos, ciertos medicamentos para el VIH y algunos medicamentos para las convulsiones.  · Toma ACO que han vencido.  · Se olvida de recomenzar el uso en el día 7 si usa el envase de 21 días.  Riesgos asociados con el uso de los anticonceptivos orales  Los anticonceptivos orales pueden, en algunos casos, causar efectos secundarios, como los siguientes:  · Dolor de cabeza.  · Depresión.  · Dificultad para dormir.  · Náuseas y vómitos.  · Dolor a la palpación en las mamas.  · Sangrado o manchado irregulares durante los primeros meses.  · Meteorismo o retención de líquidos.  · Aumento de la presión arterial.  Las píldoras combinadas también se asocian con un pequeño aumento en el riesgo de:  · Coágulos de sangre.  · Infarto de miocardio.  · Accidente cerebrovascular.  Resumen  · Los anticonceptivos orales son medicamentos que se toman por vía oral para evitar un embarazo. Son muy efectivos cuando se toman exactamente como se indica.  · Los tipos más comunes de anticonceptivos orales contienen estrógeno y progestina (progesterona sintética), o solo progestina.  · Antes de comenzar a tomar anticonceptivos orales, debe hacerse un examen físico, análisis de sangre y una prueba de Papanicolaou. El médico se asegurará de que usted sea una buena candidata para usar anticonceptivos orales.  · La píldora combinada puede venir en paquetes de 21, 28 o 91 días. La minipíldora contiene la hormona progesterona solamente y viene en paquetes de 28 píldoras.  · Los anticonceptivos orales a veces pueden causar efectos secundarios, como dolor de cabeza, náuseas, dolor a la palpación en las mamas o sangrado irregular.  Esta información no tiene como fin reemplazar el consejo del médico. Asegúrese de hacerle al médico cualquier pregunta que tenga.  Document Released: 09/03/2005 Document Revised: 08/31/2017 Document Reviewed:  08/31/2017  Elsevier Interactive Patient Education © 2019 Elsevier Inc.

## 2019-03-19 IMAGING — US US OB < 14 WEEKS - US OB TV
1 series · 15 of 28 positions shown · non-contrast
Comparison: None.

CLINICAL DATA: Abdominal pain and vaginal bleeding in pregnancy.
Gestational age by LMP of 7 weeks 1 day.

EXAM:
OBSTETRIC <14 WK US AND TRANSVAGINAL OB US
TECHNIQUE: Both transabdominal and transvaginal ultrasound examinations were
performed for complete evaluation of the gestation as well as the
maternal uterus, adnexal regions, and pelvic cul-de-sac.
Transvaginal technique was performed to assess early pregnancy.

[Series 1: us ob < 14 weeks - us ob tv · 38 acquisitions, 15 frames shown]
[im 1/38]
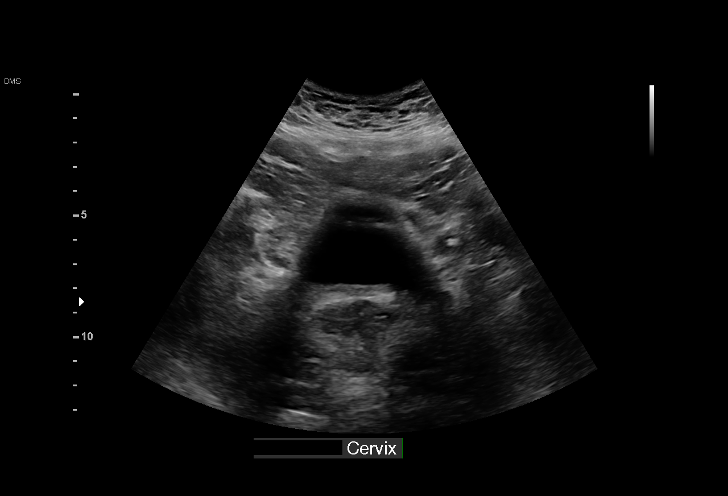
[im 3/38]
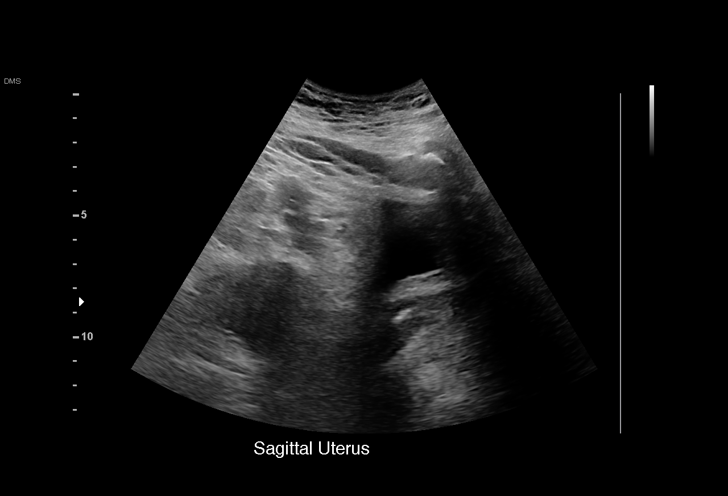
[im 6/38]
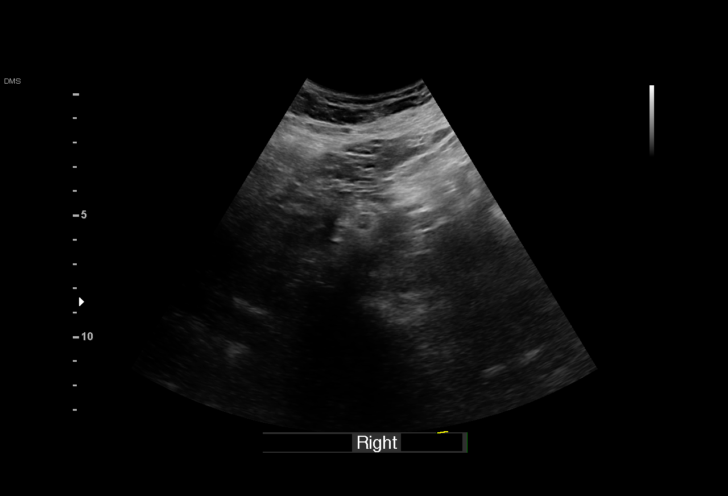
[im 9/38]
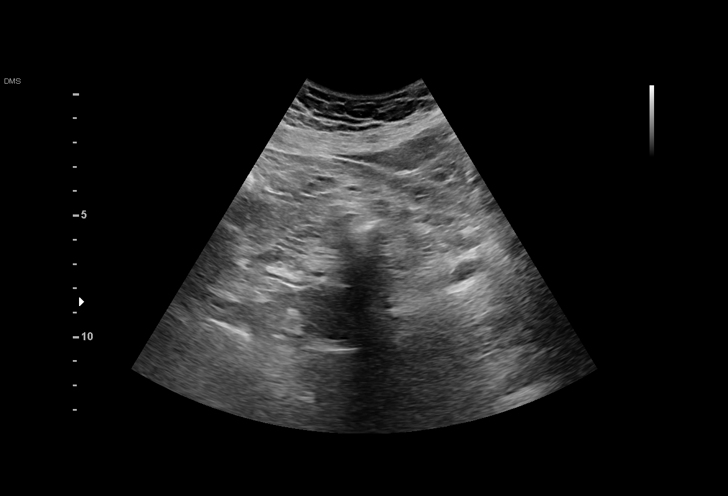
[im 11/38]
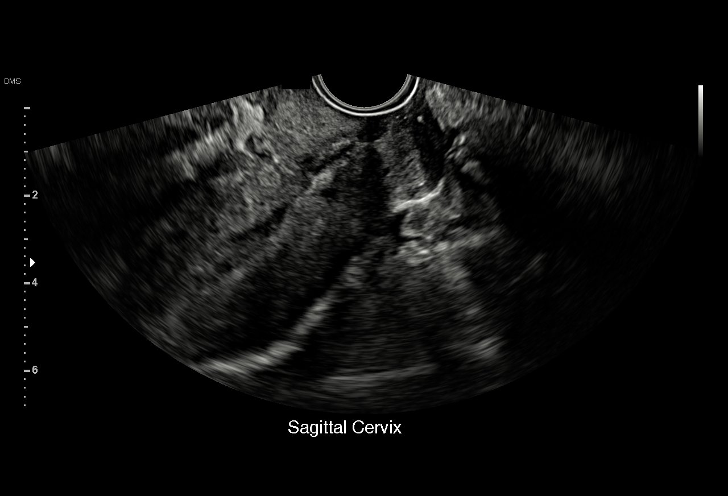
[im 14/38]
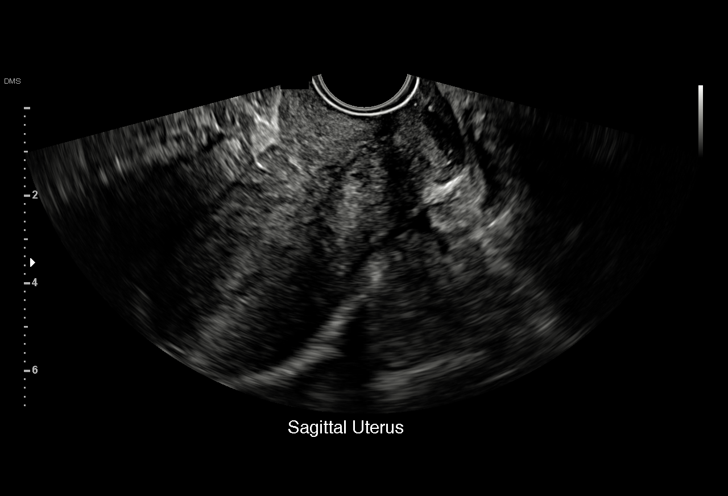
[im 17/38]
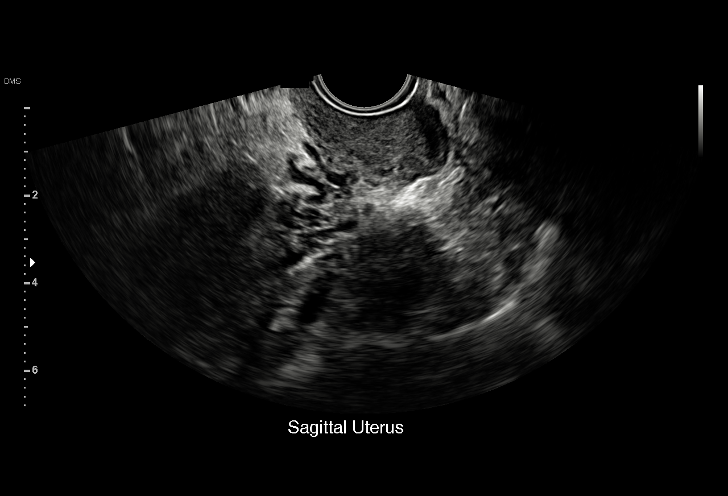
[im 20/38]
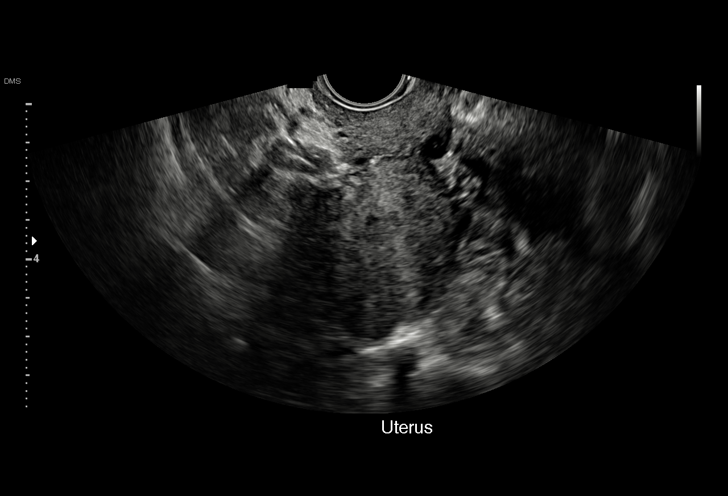
[im 21/38]
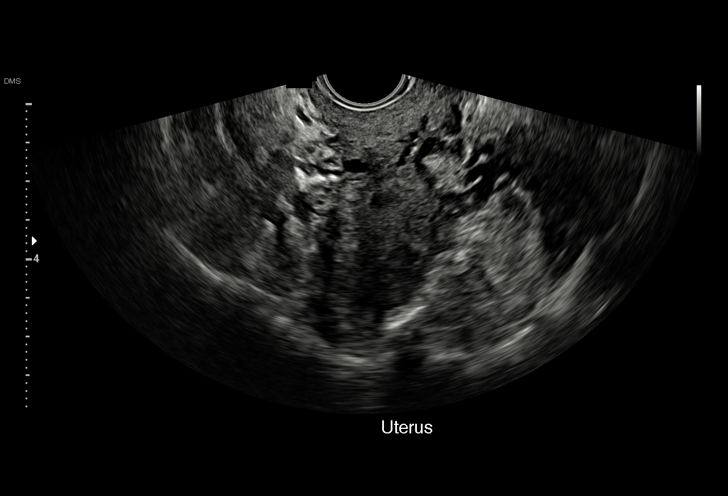
[im 24/38]
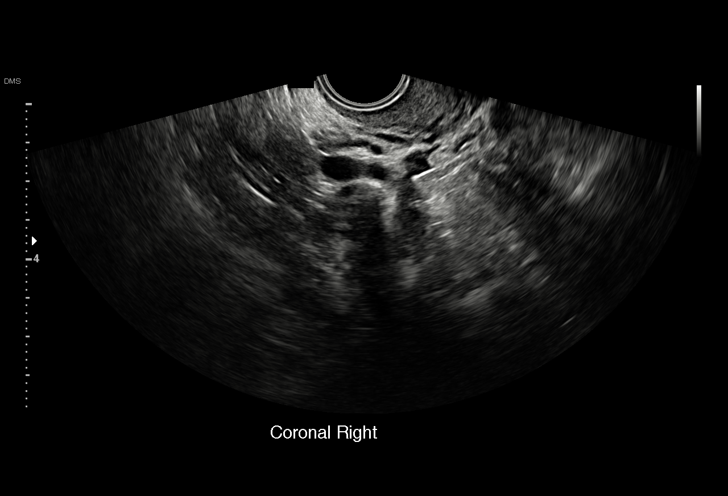
[im 27/38]
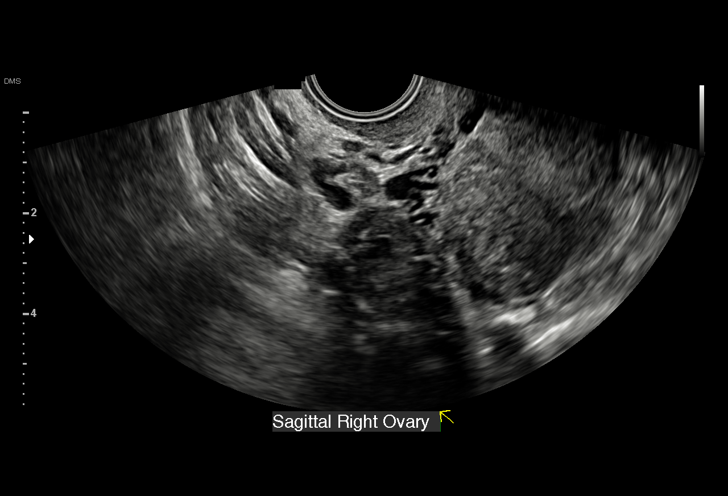
[im 29/38]
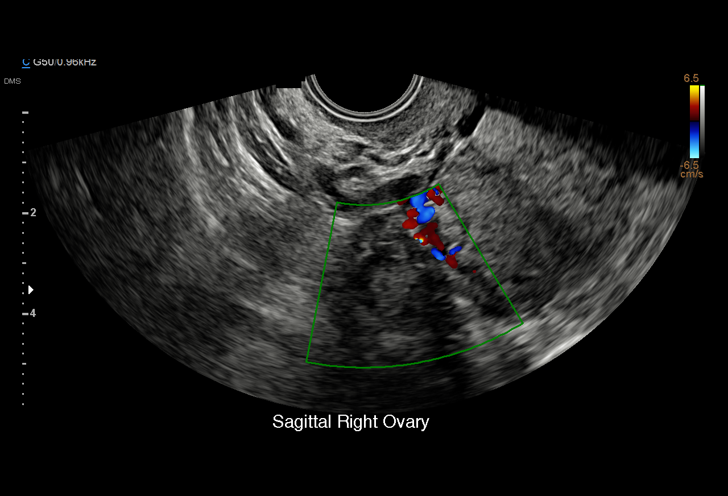
[im 32/38]
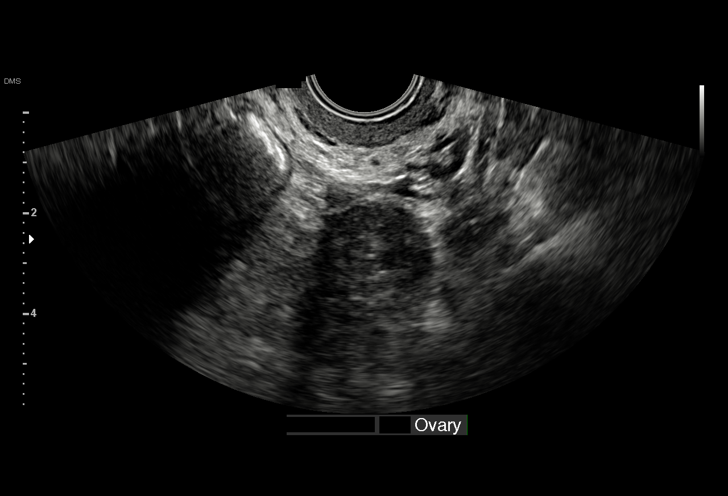
[im 35/38]
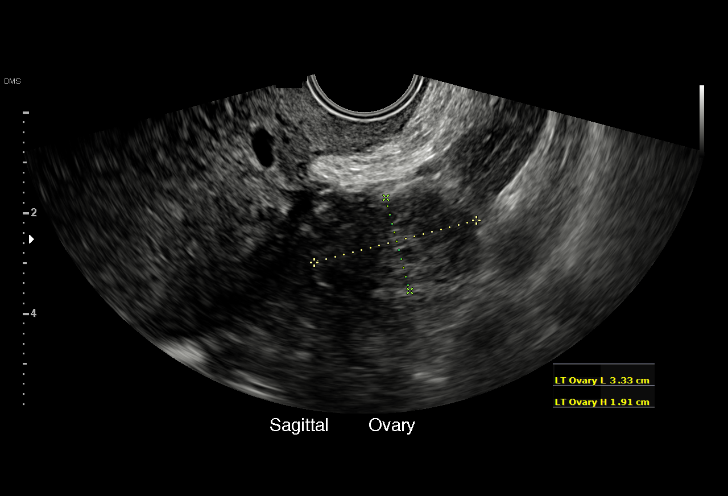
[im 38/38]
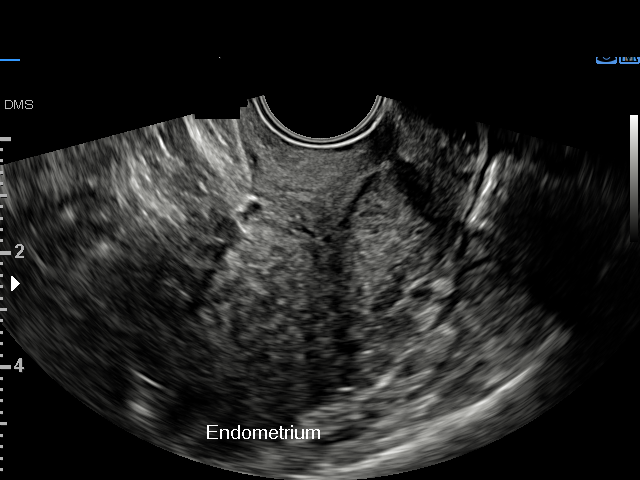

[15 of 28 positions shown; findings below may reference images not displayed]

FINDINGS: Intrauterine gestational sac: None

Maternal uterus/adnexae: Both ovaries are normal in appearance. No
mass or abnormal free fluid identified.
IMPRESSION: Pregnancy of unknown anatomic location (no intrauterine gestational
sac or adnexal mass identified). Differential diagnosis includes
recent spontaneous abortion, IUP too early to visualize, and
non-visualized ectopic pregnancy. Recommend correlation with serial
beta-hCG levels, and follow up US if warranted clinically.

## 2019-04-05 ENCOUNTER — Other Ambulatory Visit (HOSPITAL_COMMUNITY): Payer: Self-pay | Admitting: *Deleted

## 2019-04-05 DIAGNOSIS — N644 Mastodynia: Secondary | ICD-10-CM

## 2020-11-12 LAB — OB RESULTS CONSOLE GC/CHLAMYDIA
Chlamydia: NEGATIVE
Gonorrhea: NEGATIVE

## 2020-11-12 LAB — CYSTIC FIBROSIS DIAGNOSTIC STUDY: Interpretation-CFDNA:: NEGATIVE

## 2020-11-12 LAB — OB RESULTS CONSOLE ABO/RH: RH Type: POSITIVE

## 2020-11-12 LAB — OB RESULTS CONSOLE HGB/HCT, BLOOD
HCT: 32 (ref 29–41)
Hemoglobin: 10.5

## 2020-11-12 LAB — OB RESULTS CONSOLE RPR: RPR: NONREACTIVE

## 2020-11-12 LAB — OB RESULTS CONSOLE VARICELLA ZOSTER ANTIBODY, IGG: Varicella: IMMUNE

## 2020-11-12 LAB — CYTOLOGY - PAP: Pap: NEGATIVE

## 2020-11-12 LAB — OB RESULTS CONSOLE PLATELET COUNT: Platelets: 312

## 2020-11-12 LAB — OB RESULTS CONSOLE HIV ANTIBODY (ROUTINE TESTING): HIV: NONREACTIVE

## 2020-11-12 LAB — OB RESULTS CONSOLE HEPATITIS B SURFACE ANTIGEN: Hepatitis B Surface Ag: NEGATIVE

## 2020-11-12 LAB — OB RESULTS CONSOLE RUBELLA ANTIBODY, IGM: Rubella: IMMUNE

## 2020-11-12 LAB — GLUCOSE TOLERANCE, 1 HOUR: Glucose, 1 Hour GTT: 141

## 2020-11-12 LAB — OB RESULTS CONSOLE ANTIBODY SCREEN: Antibody Screen: NEGATIVE

## 2020-11-12 LAB — HEPATITIS C ANTIBODY: HCV Ab: NEGATIVE

## 2020-11-12 LAB — SICKLE CELL SCREEN: Sickle Cell Screen: NORMAL

## 2020-11-13 LAB — GLUCOSE TOLERANCE, 3 HOURS
Glucose, GTT - 1 Hour: 117 (ref ?–200)
Glucose, GTT - 2 Hour: 119 (ref ?–140)
Glucose, GTT - 3 Hour: 123 mg/dL (ref ?–140)
Glucose, GTT - Fasting: 87 mg/dL (ref 80–110)

## 2020-12-08 NOTE — L&D Delivery Note (Signed)
Obstetrical Delivery Note   Date of Delivery:   03/05/2021 Primary OB:   [facility] Gestational Age/EDD: [redacted]w[redacted]d Reason for Admission: Preterm labor Antepartum complications: GDMa1, History of VBAC  Delivered By:   Eber Hong MD (Emergency Physician)  Delivery Type:   spontaneous vaginal delivery  Delivery Details:   I was called for imminent delivery in Trauma Room B. By the time I arrived, infant had delivered. Per Dr. Hyacinth Meeker, there were no issues with delivery. Infant vigorous and crying with cord still attached; OB rapid response nurse already present. I asked if it had been a minute and she said that it had been. We clamped and cut the cord and by this time the NICU team had arrived and the baby was passed off to them and was continuing to do well.  Pitocin 10U IM was given and placenta delivered easily. No tears Anesthesia:    none Intrapartum complications: None GBS:    Unknown Laceration:    none Episiotomy:    none Rectal exam:   Deferred Placenta:    Delivered and expressed via active management. Intact: yes. To pathology: yes.  Delayed Cord Clamping: yes Estimated Blood Loss:  9mL  Baby:    Liveborn female, APGARs 8/9, weight 2550gm  Cornelia Copa. MD Attending Center for [employer] [facility])

## 2021-01-31 LAB — OB RESULTS CONSOLE HIV ANTIBODY (ROUTINE TESTING): HIV: NONREACTIVE

## 2021-01-31 LAB — OB RESULTS CONSOLE RPR: RPR: NONREACTIVE

## 2021-01-31 LAB — OB RESULTS CONSOLE HGB/HCT, BLOOD
HCT: 35 (ref 29–41)
Hemoglobin: 11.9

## 2021-01-31 LAB — GLUCOSE TOLERANCE, 1 HOUR: Glucose, 1 Hour GTT: 153

## 2021-02-07 LAB — GLUCOSE TOLERANCE, 3 HOURS
Glucose, GTT - 1 Hour: 142 (ref ?–200)
Glucose, GTT - 2 Hour: 137 (ref ?–140)
Glucose, GTT - 3 Hour: 162 mg/dL — AB (ref ?–140)
Glucose, GTT - Fasting: 95 mg/dL (ref 80–110)

## 2021-02-20 ENCOUNTER — Encounter: Payer: Self-pay | Admitting: General Practice

## 2021-02-20 DIAGNOSIS — Z98891 History of uterine scar from previous surgery: Secondary | ICD-10-CM

## 2021-02-20 DIAGNOSIS — O0990 Supervision of high risk pregnancy, unspecified, unspecified trimester: Secondary | ICD-10-CM

## 2021-02-20 DIAGNOSIS — O24419 Gestational diabetes mellitus in pregnancy, unspecified control: Secondary | ICD-10-CM | POA: Insufficient documentation

## 2021-02-20 DIAGNOSIS — O09523 Supervision of elderly multigravida, third trimester: Secondary | ICD-10-CM

## 2021-02-20 DIAGNOSIS — O24410 Gestational diabetes mellitus in pregnancy, diet controlled: Secondary | ICD-10-CM

## 2021-02-20 DIAGNOSIS — O09529 Supervision of elderly multigravida, unspecified trimester: Secondary | ICD-10-CM | POA: Insufficient documentation

## 2021-02-21 ENCOUNTER — Ambulatory Visit (INDEPENDENT_AMBULATORY_CARE_PROVIDER_SITE_OTHER): Payer: Self-pay | Admitting: Obstetrics and Gynecology

## 2021-02-21 ENCOUNTER — Encounter: Payer: Self-pay | Admitting: Obstetrics and Gynecology

## 2021-02-21 ENCOUNTER — Other Ambulatory Visit: Payer: Self-pay

## 2021-02-21 ENCOUNTER — Ambulatory Visit: Payer: Self-pay | Admitting: Registered"

## 2021-02-21 ENCOUNTER — Encounter: Payer: Self-pay | Attending: Obstetrics and Gynecology | Admitting: Registered"

## 2021-02-21 VITALS — BP 111/79 | HR 83 | Wt 162.3 lb

## 2021-02-21 DIAGNOSIS — O24419 Gestational diabetes mellitus in pregnancy, unspecified control: Secondary | ICD-10-CM | POA: Insufficient documentation

## 2021-02-21 DIAGNOSIS — O09529 Supervision of elderly multigravida, unspecified trimester: Secondary | ICD-10-CM

## 2021-02-21 DIAGNOSIS — O0990 Supervision of high risk pregnancy, unspecified, unspecified trimester: Secondary | ICD-10-CM

## 2021-02-21 DIAGNOSIS — O24410 Gestational diabetes mellitus in pregnancy, diet controlled: Secondary | ICD-10-CM

## 2021-02-21 DIAGNOSIS — Z559 Problems related to education and literacy, unspecified: Secondary | ICD-10-CM

## 2021-02-21 DIAGNOSIS — Z3A32 32 weeks gestation of pregnancy: Secondary | ICD-10-CM | POA: Insufficient documentation

## 2021-02-21 DIAGNOSIS — Z98891 History of uterine scar from previous surgery: Secondary | ICD-10-CM

## 2021-02-21 LAB — POCT URINALYSIS DIP (DEVICE)
Bilirubin Urine: NEGATIVE
Glucose, UA: NEGATIVE mg/dL
Hgb urine dipstick: NEGATIVE
Ketones, ur: NEGATIVE mg/dL
Leukocytes,Ua: NEGATIVE
Nitrite: NEGATIVE
Protein, ur: NEGATIVE mg/dL
Specific Gravity, Urine: 1.015 (ref 1.005–1.030)
Urobilinogen, UA: 0.2 mg/dL (ref 0.0–1.0)
pH: 6 (ref 5.0–8.0)

## 2021-02-21 NOTE — Patient Instructions (Signed)
Dolor del ligamento redondo Round Ligament Pain  El ligamento redondo es un cordn de msculo y tejido que sirve de sostn para Careers information officer. Puede volverse una fuente de dolor durante el embarazo si se distiende o se torsiona a medida que el beb crece. Generalmente, el dolor [employer] trimestre (semanas 13 a 28) de [place], y Software engineer y Landscape architect el momento del [place]. No se trata de un problema grave y no es perjudicial para el beb. El dolor del ligamento redondo suele ser agudo y punzante, y durar poco tiempo, pero tambin puede ser sordo, persistente y continuo. Se lo percibe en la regin inferior del abdomen o en la ingle. A menudo comienza en la zona ms profunda de la ingle y se extiende hacia regin externa de la cadera. El dolor puede producirse cuando usted:  Cambia sbitamente de posicin, como pasar rpidamente de estar sentada a ponerse de pie.  Se da vuelta en la cama.  Tose o estornuda.  Hace actividad fsica. Siga estas indicaciones en su casa:  Controle su afeccin para detectar cualquier cambio.  Cuando el dolor comience, reljese. Luego pruebe cualquiera de estos mtodos para aliviar el dolor: ? Psychologist, counselling. ? Flexionar las rodillas hacia el abdomen. ? Acostarse de costado con una almohada debajo del abdomen y [employer] las piernas. ? Sentarse en una baera con agua tibia durante 15 a o hasta que el dolor desaparezca.  Tome los medicamentos de venta libre y los recetados solamente como se lo haya indicado el mdico.  Muvase lentamente cuando se siente o se ponga de pie.  No haga caminatas largas si le generan dolor.  Suspenda o reduzca las actividades fsicas si Public relations account executive.  Concurra a todas las visitas de control como se lo haya indicado el mdico. Esto es importante.   Comunquese con un mdico si:  El dolor no desaparece con Scientist, research (medical).  Tiene un dolor en la espalda que no tena antes.  El medicamento no resulta  eficaz. Solicite ayuda inmediatamente si:  Tiene fiebre o escalofros.  Tiene contracciones uterinas.  Tiene una hemorragia vaginal abundante.  Tiene nuseas o vmitos.  Tiene diarrea.  Siente dolor al [employer]. Resumen  El dolor del ligamento redondo se siente en la parte inferior del abdomen o la ingle. Generalmente es un dolor agudo y punzante, y dura poco tiempo. Tambin puede ser un dolor sordo, persistente y continuo.  Este dolor por lo general empieza en el segundo trimestre (semanas 13 a 28). Se produce porque el tero se estira a medida que el beb crece, y no es perjudicial para el beb.  Usted puede notar el dolor cuando cambia sbitamente de posicin, cuando toce o estornuda, o durante la actividad fsica.  Relajarse, flexionar las rodillas hacia el abdomen, acostarse sobre un lado o tomar un bao de agua tibia pueden ayudar a Engineer, site.  Solicite ayuda a su mdico si el dolor no desaparece o si tiene hemorragia vaginal, nuseas, vmitos, diarrea o dolor al [employer]. Esta informacin no tiene Theme park manager el consejo del mdico. Asegrese de hacerle al mdico cualquier pregunta que tenga. Document Revised: 07/08/2018 Document Reviewed: 07/08/2018 Elsevier Patient Education  2021 Elsevier Inc. Diabetes mellitus gestacional, cuidados personales Gestational Diabetes Mellitus, Self-Care Las mujeres que tienen diabetes mellitus gestacional deben mantener su nivel de azcar en la sangre (glucosa) dentro de lmites saludables. Cules son los riesgos? Si no recibe tratamiento, esta afeccin puede causar problemas a usted y a  su beb en gestacin. Para la mam  Dar a luz al beb de manera temprana.  Tener problemas durante el [place] de parto y al dar a Patent examiner.  Necesitar una ciruga para dar a luz al beb (parto por cesrea).  Tener problemas con la presin arterial.  Tener esta forma de diabetes de nuevo al estar embarazada.  Desarrollar diabetes tipo2 en  el futuro. Para el beb  [employer] de Banker.  Tamao corporal ms grande que lo normal.  Problemas respiratorios. Cmo Ambulance person su nivel de azcar en la sangre todos los das durante el [place]. Contrlelo con la frecuencia que le haya indicado el mdico. Para hacer esto: 1. Lvese las manos con agua y [place] durante al menos 20segundos. 2. Pnchese el costado del dedo (no en la punta) con la lanceta. Use un dedo diferente cada vez. 3. Frote suavemente el dedo hasta que aparezca una pequea gota de [place]. 4. Siga las instrucciones que vienen con el medidor para: ? Patent attorney. ? Poner sangre en la tira reactiva. ? Obtener el resultado. 5. Registre su resultado y las observaciones que desee. En general, sus niveles de azcar en la sangre deben ser los siguientes:  95 mg/dl (5.3 mmol/l) si no ha comido.  140 mg/dl (7.8 mmol/l) 1 hora despus de una comida.  120 mg/dl (6.7 mmol/l) 2 horas despus de una comida.   Siga estas instrucciones en su casa: Medicamentos  Use los medicamentos de venta libre y los recetados solamente como se lo haya indicado el mdico.  Si el mdico le recet insulina u otros medicamentos para la diabetes, aplquesela o tmelos todos los das: ? Aplquese o tome los medicamentos todos [place]. ? No se quede sin insulina o sin otros medicamentos. Planifique con antelacin para tenerlos siempre. Comida y bebida  Siga las instrucciones del mdico respecto de las restricciones en las comidas o las bebidas.  Consulte a un experto en alimentacin (nutricionista) que le ayude a crear un plan de alimentacin para Psychologist, clinical. Los alimentos de este plan deben incluir lo siguiente: ? Protenas con bajo contenido de [place]. ? Frijoles secos, frutos secos, y panes, cereales o pasta integrales. ? Nils Pyle y verduras frescas. ? Productos lcteos con bajo contenido de grasa. ? Grasas  saludables.  Ingiera refrigerios saludables entre comidas nutritivas.  Beba suficiente lquido para Radio producer pis (orina) de color amarillo plido.  Lleve un registro de los hidratos de carbono que consume. Para hacer esto: ? Lea las etiquetas de los alimentos. ? Aprenda cules son los tamaos de las porciones de los alimentos.  Siga su plan para los das de enfermedad cuando no pueda comer ni beber normalmente. [employer] plan con el mdico, de modo que est listo para usarlo.   Actividad  [address] [employer] ejercicios como se lo haya indicado el mdico.  Haga actividad fsica durante o ms por da, o durante el tiempo que el [employer] recomiende. Puede ayudar a Medical sales representative de azcar en la sangre despus de una comida si: ? Hace 10 minutos de actividad fsica despus de cada comida. ? Comienza esa actividad fsica 30 minutos despus de la comida.  Hable con el mdico antes de comenzar una rutina de ejercicio nueva. Es posible que el mdico le diga que haga cambios en la [place], otros medicamentos o los alimentos. Estilo de vida  No beba alcohol.  No consuma ningn  producto que contenga nicotina o tabaco, como cigarrillos, cigarrillos electrnicos y tabaco de Theatre manager. Si necesita ayuda para dejar de consumir estos productos, consulte al mdico.  Aprenda cmo sobrellevar el estrs. Si necesita ayuda para lograrlo, consulte al [employer]. Cuidado del cuerpo  Mantenga las vacunas al da.  [employer]. Para hacer esto: ? Cepllese los dientes y las [address] veces al da. ? Psese hilo dental una o ms veces por da. ? Vaya al dentista una vez cada o con ms frecuencia.  Mantenga un peso Office manager. Indicaciones generales  Pregntele al [employer] riesgos de la hipertensin arterial en el embarazo.  Comparta su plan de atencin de la diabetes con: ? Sus compaeros de trabajo o de la escuela. ? [employer] con las que  [place].  Hgase pruebas de orina para Landscape architect presencia de cetonas: ? Cuando est enferma. ? Como se lo haya indicado el mdico.  Lleve consigo una tarjeta, o use un brazalete que diga que tiene diabetes.  Cumpla con todas las visitas de seguimiento. Cuidados despus del parto  Hgase controlar el nivel de azcar en la sangre 4 a 12semanas despus del parto.  Hgase controlar si tiene diabetes una o ms veces cada 3 aos o segn le hayan indicado. Dnde buscar ms informacin  American Diabetes Association (ADA) (Asociacin Estadounidense de la Diabetes): diabetes.org  Association of Diabetes Care & Education Specialists (ADCES) (Asociacin de Especialistas en Atencin y Educacin sobre la Diabetes): diabeteseducator.org  Centers for Disease Control and Prevention (Centros para el Control y la Prevencin de [place], CDC): TonerPromos.no  American Pregnancy Association (Asociacin Americana del [place]): americanpregnancy.org  U.S. Department of Therapist, sports (MyPlate del Departamento de Agricultura de los EE.UU.): WrestlingReporter.dk Comunquese con un mdico si:  Su azcar en la sangre est por encima de su valor ideal en dospruebas consecutivas.  Tiene fiebre.  Ha estado enferma durante 2 o ms das y no mejora.  Tiene cualquiera de estos problemas durante ms de 6horas: ? Vomita cada vez que come o bebe. ? Presenta heces lquidas (diarrea). Solicite ayuda de inmediato si:  No puede pensar con claridad.  Tiene dificultad para respirar.  Tiene un nivel moderado o alto de cetonas en la [place].  Le comienza a salir lquido anmalo o sangre de la vagina.  Siente que el beb no se mueve tanto como es habitual.  Comienza a tener contracciones de [place] temprana. Siente que el vientre se endurece.  Tiene un dolor de cabeza muy intenso. Estos sntomas pueden Customer service manager. Solicite ayuda de inmediato. Comunquese con el servicio de emergencias de su  localidad (911 en los [place]).  No espere a ver si los sntomas desaparecen.  No conduzca por sus propios medios [employer] hospital. Resumen  Controle su azcar en la sangre (glucosa) mientras est embarazada. Contrlelo con la frecuencia que le haya indicado el mdico.  Aplquese la insulina y tome los medicamentos para la diabetes como se lo hayan indicado.  Hgase controlar el nivel de azcar en la sangre 4 a 12semanas despus del parto.  Cumpla con todas las visitas de seguimiento. Esta informacin no tiene Theme park manager el consejo del mdico. Asegrese de hacerle al mdico cualquier pregunta que tenga. Document Revised: 06/26/2020 Document Reviewed: 06/26/2020 Elsevier Patient Education  2021 [employer].

## 2021-02-21 NOTE — Progress Notes (Signed)
C/o vaginal disccomfort when firsts gets up, sometimes if walks a lot. Murphy Bundick,RN

## 2021-02-21 NOTE — Progress Notes (Signed)
Interpreter services provided by Malachi Bonds [phone] from [place]  Patient was seen on 02/21/21 for Gestational Diabetes self-management. EDD 04/12/21; [redacted]w[redacted]d; . Patient states no history of GDM. Diet history obtained. Patient eats [place] cultural foods, 2-3 tortillas with lunch and dinner, only occasional vegetables. Beverages include water: 2-3 bottles, milk with a little coffee and 1 Tbs of sugar 4x/week; 8 oz WIC juice.  Patient was extremely hesitant to prick finger and asked several times how often she needs to check. Does not want to check 4x/day. RD should have spent more time building rapport with patient and will consider working to build more trust at follow-up visit. Pt was very resistant to providing information on her diet and why she doesn't want to check her blood sugar.   The following learning objectives were met by the patient :   States when to check blood glucose levels  Demonstrates proper blood glucose monitoring techniques  Plan:  Begin checking Blood Glucose before breakfast and 2 hours after first bite of breakfast, lunch and dinner as directed by MD  Bring Log Book/Sheet and meter to every medical appointment  Baby Scripts: (BS 2.0 not capable of glucose management at this time.) Patient to record blood sugar on glucose log sheet  Take medication if directed by MD Return for complete GDM education  Blood glucose monitor given: [employer] Lot # [id number] CBG: 108 mg/dL  Patient instructed to monitor glucose levels: FBS: 60 - 95 mg/dl 2 hour: <120 mg/dl  Patient received the following handouts:  Nutrition Diabetes and Pregnancy  Carbohydrate Counting List  Blood glucose Log Sheet  Patient will be seen for follow-up in 1 weeks or as needed.

## 2021-02-21 NOTE — Progress Notes (Signed)
   PRENATAL VISIT NOTE  Subjective:  Kristi Nguyen is a 40 y.o. [id number] at [redacted]w[redacted]d being seen today for ongoing prenatal care.  She is currently monitored for the following issues for this high-risk pregnancy and has Cervical dysplasia; Supervision of high risk pregnancy, antepartum; Advanced maternal age in multigravida; Gestational diabetes; History of C-section; Special educational needs; [redacted] weeks gestation of pregnancy; and History of VBAC on their problem list.  Patient doing well with no acute concerns today. She reports no complaints.  Contractions: Not present. Vag. Bleeding: None.  Movement: Present. Denies leaking of fluid.   Pt recently diagnosed with gestational diabetes and received training today  The following portions of the patient's history were reviewed and updated as appropriate: allergies, current medications, past family history, past medical history, past social history, past surgical history and problem list. Problem list updated.  Objective:   Vitals:   02/21/21 1029  BP: 111/79  Pulse: 83  Weight: 162 lb 4.8 oz (73.6 kg)    Fetal Status: Fetal Heart Rate (bpm): 137   Movement: Present     General:  Alert, oriented and cooperative. Patient is in no acute distress.  Skin: Skin is warm and dry. No rash noted.   Cardiovascular: Normal heart rate noted  Respiratory: Normal respiratory effort, no problems with respiration noted  Abdomen: Soft, gravid, appropriate for gestational age.  Pain/Pressure: Present     Pelvic: Cervical exam deferred        Extremities: Normal range of motion.  Edema: None  Mental Status:  Normal mood and affect. Normal behavior. Normal judgment and thought content.   Assessment and Plan:  Pregnancy: [id number] at [redacted]w[redacted]d  1. Diet controlled gestational diabetes mellitus (GDM), antepartum Pt just trained in diabetic diet, will assess blood sugar control in 1 week - [place] MFM OB FOLLOW UP; Future  2. Supervision of high risk  pregnancy, antepartum   3. Antepartum multigravida of advanced maternal age   1. History of C-section Will need to discuss delivery route  5. Special educational needs Pt has 4th grade education, needs concepts explained in simple terms  6. [redacted] weeks gestation of pregnancy   7. History of VBAC Discuss delivery route  Preterm labor symptoms and general obstetric precautions including but not limited to vaginal bleeding, contractions, leaking of fluid and fetal movement were reviewed in detail with the patient.  Please refer to After Visit Summary for other counseling recommendations.   Return in about 1 week (around 02/28/2021) for [facility], in person.   Mariel Aloe, MD Faculty Attending Center for [facility]

## 2021-02-28 ENCOUNTER — Encounter: Payer: Self-pay | Admitting: Registered"

## 2021-02-28 ENCOUNTER — Other Ambulatory Visit: Payer: Self-pay

## 2021-02-28 ENCOUNTER — Ambulatory Visit: Payer: Self-pay | Admitting: Registered"

## 2021-02-28 DIAGNOSIS — O24419 Gestational diabetes mellitus in pregnancy, unspecified control: Secondary | ICD-10-CM

## 2021-02-28 NOTE — Progress Notes (Signed)
Interpreter services provided by Darien Ramus from [employer] for nutrition part of visit. In-person interpreter Raquel from [facility] helped with understanding how to check blood sugar.  Patient was seen on 02/28/21 for follow-up assessment and education for Gestational Diabetes. EDD 04/13/21; [redacted]w[redacted]d. Patient states changes to diet/lifestyle including eating more protein such as eggs, cheese and instead of 5-6 tortillas is now eating 1-2 tortillas.   RD provided the green carbohydrate card in Spanish and appears that patient cannot read.  Patient is having difficulty testing blood glucose and was getting erros on meter for last couple of days. Patient demonstrated technique during visit and appears she may have been getting the strip wet and also has been applying blood to the top of the strip instead of the edge. Pt states she had been using hydrogen peroxide to clean hands before testing. RD worked with patient in correct technique. Patient also had been discarding used needles in the trash instead of hard plastic container.    The following learning objectives reviewed during follow-up visit:   Correct CBG testing technique  Carbohydrate servings  Using CBGs to determine if changes to diet are necessary  Plan:  . Start adjust servings size of carbs based on hunger level and blood sugar readings. . Use soap and water to clean hands. Make sure hands are dry before getting strip out of bottle. . Use the lowest depth setting to reduce pain from pricking fingers.   Patient instructed to monitor glucose levels: FBS: 60 - 95 mg/dl 2 hour: <161 mg/dl  Patient received the following handouts:  Carb counting card in Spanish  Patient will be seen for follow-up in 3 weeks or as needed.

## 2021-03-04 ENCOUNTER — Encounter: Payer: Self-pay | Admitting: Obstetrics and Gynecology

## 2021-03-04 ENCOUNTER — Other Ambulatory Visit: Payer: Self-pay

## 2021-03-04 ENCOUNTER — Other Ambulatory Visit: Payer: Self-pay | Admitting: *Deleted

## 2021-03-04 ENCOUNTER — Other Ambulatory Visit (HOSPITAL_COMMUNITY)
Admission: RE | Admit: 2021-03-04 | Discharge: 2021-03-04 | Disposition: A | Discharge status: Home / Self Care | Payer: Self-pay | Source: Ambulatory Visit | Attending: Obstetrics and Gynecology | Admitting: Obstetrics and Gynecology

## 2021-03-04 ENCOUNTER — Ambulatory Visit (INDEPENDENT_AMBULATORY_CARE_PROVIDER_SITE_OTHER): Payer: Self-pay | Admitting: Obstetrics and Gynecology

## 2021-03-04 VITALS — BP 117/82 | HR 87 | Wt 165.5 lb

## 2021-03-04 DIAGNOSIS — O24419 Gestational diabetes mellitus in pregnancy, unspecified control: Secondary | ICD-10-CM

## 2021-03-04 DIAGNOSIS — O0990 Supervision of high risk pregnancy, unspecified, unspecified trimester: Secondary | ICD-10-CM

## 2021-03-04 DIAGNOSIS — O24410 Gestational diabetes mellitus in pregnancy, diet controlled: Secondary | ICD-10-CM

## 2021-03-04 DIAGNOSIS — N898 Other specified noninflammatory disorders of vagina: Secondary | ICD-10-CM | POA: Insufficient documentation

## 2021-03-04 DIAGNOSIS — Z98891 History of uterine scar from previous surgery: Secondary | ICD-10-CM

## 2021-03-04 DIAGNOSIS — O09523 Supervision of elderly multigravida, third trimester: Secondary | ICD-10-CM

## 2021-03-04 MED ORDER — PREPLUS 27-1 MG PO TABS: 1.0000 | ORAL_TABLET | Freq: Every day | ORAL | 13 refills | Status: AC

## 2021-03-04 NOTE — Progress Notes (Signed)
Patient complains of mucus discharge that started yesterday (yellowish color) and watery type leakage. Contractions every 20 minutes. Also pelvic and pain & pressure ( rates an 8) that started since last visit with [place] (02/21/21).

## 2021-03-04 NOTE — Addendum Note (Signed)
Addended by: Guy Begin on: 03/04/2021 10:35 AM   Modules accepted: Orders

## 2021-03-04 NOTE — Progress Notes (Signed)
Subjective:  Kristi Nguyen is a 40 y.o. [id number] at [redacted]w[redacted]d being seen today for ongoing prenatal care.  She is currently monitored for the following issues for this high-risk pregnancy and has Cervical dysplasia; Supervision of high risk pregnancy, antepartum; Advanced maternal age in multigravida; Gestational diabetes; History of C-section; Special educational needs; and History of VBAC on their problem list.  Patient reports episode of gush of fluid yesterday, none since then, some vaginal discharge.  Contractions: Irregular. Vag. Bleeding: None.  Movement: Present. Denies leaking of fluid.   The following portions of the patient's history were reviewed and updated as appropriate: allergies, current medications, past family history, past medical history, past social history, past surgical history and problem list. Problem list updated.  Objective:   Vitals:   03/04/21 0830  BP: 117/82  Pulse: 87  Weight: 165 lb 8 oz (75.1 kg)    Fetal Status: Fetal Heart Rate (bpm): 140   Movement: Present     General:  Alert, oriented and cooperative. Patient is in no acute distress.  Skin: Skin is warm and dry. No rash noted.   Cardiovascular: Normal heart rate noted  Respiratory: Normal respiratory effort, no problems with respiration noted  Abdomen: Soft, gravid, appropriate for gestational age. Pain/Pressure: Present     Pelvic:  Cervical exam performed        Extremities: Normal range of motion.  Edema: Trace  Mental Status: Normal mood and affect. Normal behavior. Normal judgment and thought content.   Urinalysis:      Assessment and Plan:  Pregnancy: [id number] at [redacted]w[redacted]d  1. Supervision of high risk pregnancy, antepartum Stable - Prenatal Vit-Fe Fumarate-FA (PREPLUS) 27-1 MG TABS; Take 1 tablet by mouth daily.  Dispense: 30 tablet; Refill: 13  Fern, pooling negative Cervical swab collected 2. Diet controlled gestational diabetes mellitus (GDM) in third trimester CBG's in goal  range Growth scan 03/14/21  3. Multigravida of advanced maternal age in third trimester   4. History of C-section   5. History of VBAC Prior successful VBAC Desires TOLAC again Consent signed today  Live interrupted used during today's visit Preterm labor symptoms and general obstetric precautions including but not limited to vaginal bleeding, contractions, leaking of fluid and fetal movement were reviewed in detail with the patient. Please refer to After Visit Summary for other counseling recommendations.  Return in about 2 weeks (around 03/18/2021) for OB visit, face to face, MD only.   Hermina Staggers, MD

## 2021-03-04 NOTE — Patient Instructions (Signed)
Tercer trimestre de embarazo Third Trimester of Pregnancy  El tercer trimestre de embarazo va desde la semana 28 hasta la semana 40. Tambin se dice que va desde el mes 7 hasta el mes 9. En este trimestre, el beb en gestacin (feto) crece muy rpidamente. Hacia el final del noveno mes, el beb en gestacin mide alrededor de 20pulgadas (45cm) de largo. Pesa entre 6y 10libras (2,70y 4,50kg). Cambios en el cuerpo durante el tercer trimestre Su organismo contina atravesando por muchos cambios durante este perodo. Los cambios varan y generalmente vuelven a la normalidad despus del nacimiento del beb. Cambios fsicos  Seguir aumentando de peso. Puede ser que aumente entre 25 y 35libras (11 y 16kg) hacia el final del embarazo. Si tiene bajo peso, puede aumentar entre 28 y 40lb (unos 13 a 18kg). Si tiene sobrepeso, puede aumentar entre 15 y 25 libras (unos 7 a 11kg).  Podrn aparecer las primeras estras en las caderas, el vientre (abdomen) y las mamas.  Las mamas seguirn creciendo y pueden doler. Un lquido amarillo (calostro) puede salir de sus pechos. Esta es la primera leche que usted produce para el beb.  Tal vez haya cambios en el cabello.  El ombligo puede salir hacia afuera.  Puede observar que se le hinchan ms las manos, la cara o los tobillos. Cambios en la salud  Es posible que tenga acidez estomacal.  Es posible que tenga dificultades para defecar (estreimiento).  Pueden aparecerle hemorroides. Estas son venas hinchadas en el ano que pueden picar o doler.  Puede comenzar a tener venas hinchadas (vrices) en las piernas.  Puede presentar ms dolor en la pelvis, la espalda o los muslos.  Puede presentar ms hormigueo o entumecimiento en las manos, los brazos y las piernas. La piel de su vientre tambin puede sentirse entumecida.  Es posible que sienta falta de aire a medida que el tero se agranda. Otros cambios  Es posible que haga pis (orine) con mayor  frecuencia.  Puede tener ms problemas para dormir.  Puede notar que el beb en gestacin "baja" o se mueve ms hacia bajo, en el vientre.  Puede notar ms secrecin proveniente de la vagina.  Puede sentir las articulaciones flojas y puede sentir dolor alrededor del hueso plvico. Siga estas instrucciones en su casa: Medicamentos  Use los medicamentos de venta libre y los recetados solamente como se lo haya indicado el mdico. Algunos medicamentos no son seguros durante el embarazo.  Tome vitaminas prenatales que contengan por lo menos 600microgramos (mcg) de cido flico. Comida y bebida  Consuma comidas saludables que incluyan lo siguiente: ? Frutas y verduras frescas. ? Cereales integrales. ? Buenas fuentes de protenas, como carne, huevos y tofu. ? Productos lcteos con bajo contenido de grasa.  Evite la carne cruda y el jugo, la leche y el queso sin pasteurizar. Estos portan grmenes que pueden provocar dao tanto a usted como al beb.  Tome 4 o 5 comidas pequeas en lugar de 3 comidas abundantes al da.  Es posible que deba tomar medidas para prevenir o tratar los problemas para defecar: ? Beber suficiente lquido para mantener el pis (orina) de color amarillo plido. ? Come alimentos ricos en fibra. Entre ellos, frijoles, cereales integrales y frutas y verduras frescas. ? Limitar los alimentos con alto contenido de grasa y azcar. Estos incluyen alimentos fritos o dulces. Actividad  Haga ejercicios solamente como se lo haya indicado el mdico. Interrumpa la actividad fsica si comienza a tener clicos en el tero.  Evite   levantar pesos [employer].  No haga ejercicio si hace demasiado calor, hay demasiada humedad o se encuentra en un lugar de mucha altura (altitud elevada).  Si lo desea, puede continuar teniendo [employer], a menos que el mdico le indique lo contrario. Alivio del dolor y del malestar  Haga pausas con frecuencia y descanse con las piernas  levantadas (elevadas) si tiene calambres en las piernas o dolor en la parte baja de la espalda.  Dese baos de asiento con agua tibia para Best boy o las molestias causadas por las hemorroides. Use una crema para las hemorroides si el mdico la autoriza.  Use un sostn que le brinde buen soporte si sus mamas estn sensibles.  Si desarrolla venas hinchadas y abultadas en las piernas: ? Use medias de compresin segn las indicaciones de su mdico. ? Levante los pies durante 24minutos, 3 o 4veces por Training and development officer. ? Limite la sal en sus alimentos. Seguridad  Hable con el mdico antes de Control and instrumentation engineer.  No se d baos de inmersin en agua caliente, baos turcos ni saunas.  Use el cinturn de seguridad en todo momento mientras vaya en auto.  Hable con el mdico si alguien le est haciendo dao o gritando [place]. Preparacin para la llegada del beb Para prepararse para la llegada de su beb:  Tome clases prenatales.  Visite el hospital y recorra el rea de maternidad.  Compre un asiento de seguridad [employer] atrs para llevar al beb en el automvil. Aprenda cmo instalarlo en el auto.  Prepare la habitacin del beb. Saque todas las almohadas y los animales de peluche de la cuna del beb. Instrucciones generales  Evite el contacto con las bandejas sanitarias de los gatos y la tierra que estos animales usan. Estos contienen grmenes que pueden daar al beb y causar la prdida del beb ya sea aborto espontneo o muerte fetal.  No se haga duchas vaginales ni use tampones. No use tampones ni toallas higinicas perfumadas.  No fume ni consuma ningn producto que contenga nicotina o tabaco. Si necesita ayuda para dejar de fumar, consulte al mdico.  No beba alcohol.  No use medicamentos a base de hierbas, drogas ilegales, ni medicamentos que el mdico no haya autorizado. Las sustancias qumicas de estos productos pueden afectar al beb.  Cumpla con todas las  visitas de seguimiento. Esto es importante. Dnde buscar ms informacin  American Pregnancy Association (Asociacin Americana del Embarazo): americanpregnancy.org  [employer] of Obstetricians and Gynecologists (Colegio Estadounidense de Obstetras y Gineclogos): www.acog.org  Office on [employer] ([place]): KeywordPortfolios.com.br Comunquese con un mdico si:  Tiene fiebre.  Tiene clicos leves o siente presin en la parte baja del vientre.  Sufre un dolor persistente en el abdomen.  Vomita o hace deposiciones acuosas (diarrea).  Advierte lquido con mal olor que proviene de la vagina.  Siente dolor al orinar o hace orina con mal olor.  Tiene un dolor de cabeza que no desaparece despus de Teacher, adult education.  Nota cambios en la visin o ve manchas delante de los ojos. Solicite ayuda de inmediato si:  Rompe la bolsa.  Tiene contracciones regulares separadas por menos de 54minutos.  Tiene sangrado o pequeas prdidas vaginales.  Tiene clicos o dolor muy intensos en el vientre.  Tiene dificultad para respirar.  Sientes dolor en el pecho.  Se desmaya.  No ha sentido al beb moverse durante el tiempo que le indic el mdico.  Tiene dolor, hinchazn o enrojecimiento nuevos  en un brazo o una pierna o se produce un aumento de alguno de estos sntomas. Resumen  El tercer trimestre comprende desde la [employer] la [id number] (desde el mes7 hasta el mes9). Esta es la poca en que el beb en gestacin crece muy rpidamente.  Durante este perodo, las molestias pueden aumentar a medida que usted sube de peso y el beb crece.  Preprese para la llegada del beb: asista a las clases prenatales, compre un asiento de seguridad orientado hacia atrs para llevar al beb en auto y prepare la habitacin del beb.  Solicite ayuda de inmediato si tiene sangrado por la vagina, siente dolor en el pecho y tiene dificultad para respirar, o si  no ha sentido al beb moverse durante el tiempo que le indic el mdico. Esta informacin no tiene Marine scientist el consejo del mdico. Asegrese de hacerle al mdico cualquier pregunta que tenga. Document Revised: 06/06/2020 Document Reviewed: 06/06/2020 Elsevier Patient Education  [place].

## 2021-03-05 ENCOUNTER — Inpatient Hospital Stay (HOSPITAL_COMMUNITY)
Admission: AD | Admit: 2021-03-05 | Discharge: 2021-03-07 | DRG: 805 | Disposition: A | Discharge status: Home / Self Care | Payer: Medicaid Other | Attending: Obstetrics and Gynecology | Admitting: Obstetrics and Gynecology

## 2021-03-05 ENCOUNTER — Other Ambulatory Visit: Payer: Self-pay

## 2021-03-05 ENCOUNTER — Encounter (HOSPITAL_COMMUNITY): Payer: Self-pay | Admitting: Obstetrics and Gynecology

## 2021-03-05 DIAGNOSIS — Z3A34 34 weeks gestation of pregnancy: Secondary | ICD-10-CM

## 2021-03-05 DIAGNOSIS — Z20822 Contact with and (suspected) exposure to covid-19: Secondary | ICD-10-CM | POA: Diagnosis present

## 2021-03-05 DIAGNOSIS — O24419 Gestational diabetes mellitus in pregnancy, unspecified control: Secondary | ICD-10-CM | POA: Diagnosis present

## 2021-03-05 DIAGNOSIS — Z559 Problems related to education and literacy, unspecified: Secondary | ICD-10-CM

## 2021-03-05 DIAGNOSIS — M6208 Separation of muscle (nontraumatic), other site: Secondary | ICD-10-CM | POA: Diagnosis not present

## 2021-03-05 DIAGNOSIS — O99893 Other specified diseases and conditions complicating puerperium: Secondary | ICD-10-CM | POA: Diagnosis not present

## 2021-03-05 DIAGNOSIS — O34219 Maternal care for unspecified type scar from previous cesarean delivery: Secondary | ICD-10-CM | POA: Diagnosis present

## 2021-03-05 DIAGNOSIS — Z98891 History of uterine scar from previous surgery: Secondary | ICD-10-CM

## 2021-03-05 DIAGNOSIS — O34211 Maternal care for low transverse scar from previous cesarean delivery: Secondary | ICD-10-CM

## 2021-03-05 DIAGNOSIS — O24420 Gestational diabetes mellitus in childbirth, diet controlled: Principal | ICD-10-CM | POA: Diagnosis present

## 2021-03-05 DIAGNOSIS — O99355 Diseases of the nervous system complicating the puerperium: Secondary | ICD-10-CM | POA: Diagnosis not present

## 2021-03-05 DIAGNOSIS — O09529 Supervision of elderly multigravida, unspecified trimester: Secondary | ICD-10-CM

## 2021-03-05 DIAGNOSIS — G8918 Other acute postprocedural pain: Secondary | ICD-10-CM | POA: Diagnosis not present

## 2021-03-05 LAB — CBC
HCT: 39.7 % (ref 36.0–46.0)
Hemoglobin: 13.4 g/dL (ref 12.0–15.0)
MCH: 31.8 pg (ref 26.0–34.0)
MCHC: 33.8 g/dL (ref 30.0–36.0)
MCV: 94.1 fL (ref 80.0–100.0)
Platelets: 285 10*3/uL (ref 150–400)
RBC: 4.22 MIL/uL (ref 3.87–5.11)
RDW: 14.4 % (ref 11.5–15.5)
WBC: 15 10*3/uL — ABNORMAL HIGH (ref 4.0–10.5)
nRBC: 0 % (ref 0.0–0.2)

## 2021-03-05 LAB — CERVICOVAGINAL ANCILLARY ONLY
Bacterial Vaginitis (gardnerella): NEGATIVE
Candida Glabrata: NEGATIVE
Candida Vaginitis: NEGATIVE
Chlamydia: NEGATIVE
Comment: NEGATIVE
Comment: NEGATIVE
Comment: NEGATIVE
Comment: NEGATIVE
Comment: NEGATIVE
Comment: NORMAL
Neisseria Gonorrhea: NEGATIVE
Trichomonas: NEGATIVE

## 2021-03-05 MED ORDER — IBUPROFEN 600 MG PO TABS
600.0000 mg | ORAL_TABLET | Freq: Four times a day (QID) | ORAL | Status: DC
  Administered 2021-03-06 – 2021-03-07 (×6): 600 mg via ORAL
  Filled 2021-03-05 (×6): qty 1

## 2021-03-05 MED ORDER — ACETAMINOPHEN 325 MG PO TABS
650.0000 mg | ORAL_TABLET | ORAL | Status: DC | PRN
  Administered 2021-03-06 – 2021-03-07 (×4): 650 mg via ORAL
  Filled 2021-03-05 (×4): qty 2

## 2021-03-05 NOTE — ED Triage Notes (Signed)
Pt presents to ED ambulatory, pt in labor, water has broke, contractions 2-3 min apart, pt to trauma b.  MD to bedside.

## 2021-03-05 NOTE — H&P (Addendum)
Obstetrics Admission History & Physical  03/05/2021 - 11:22 PM Primary OBGYN: [facility]  Chief Complaint: preterm labor  History of Present Illness  40 y.o. [id number] @ [redacted]w[redacted]d, with the above CC. Pregnancy complicated by: h/o VBAC, 40 y.o., GDMa1.  I was called for imminent delivery in the ED and by the time I arrived, patient had already delivered via uncomplicated VBAC; see delivery note for details. Placenta delivered w/o issue and EBL 14mL  Review of Systems:  as noted in the History of Present Illness.   PMHx:  Past Medical History:  Diagnosis Date  . GDM (gestational diabetes mellitus)   . HGSIL (high grade squamous intraepithelial lesion) on Pap smear of cervix 2014   PSHx:  Past Surgical History:  Procedure Laterality Date  . CESAREAN SECTION    . LEEP     Medications: (Not in a hospital admission)    Allergies: has No Known Allergies. OBHx:  OB History  Gravida Para Term Preterm AB Living  6 3 3  0 2 3  SAB IAB Ectopic Multiple Live Births  1 0 0 0 3    # Outcome Date GA Lbr Len/2nd Weight Sex Delivery Anes PTL Lv  6 Current           5 AB 01/04/19 [redacted]w[redacted]d    SAB     4 SAB 01/31/18 [redacted]w[redacted]d         3 Term 06/04/10 [redacted]w[redacted]d  3260 g F Vag-Spont   LIV  2 Term 06/27/08 [redacted]w[redacted]d  4593 g M CS-LTranv   LIV     Birth Comments: macrosomia  1 Term 02/02/01 [redacted]w[redacted]d   M Vag-Spont   LIV           FHx: No family history on file. Soc Hx:  Social History   Socioeconomic History  . Marital status: Married    Spouse name: Not on file  . Number of children: Not on file  . Years of education: Not on file  . Highest education level: Not on file  Occupational History  . Not on file  Tobacco Use  . Smoking status: Never Smoker  . Smokeless tobacco: Never Used  Vaping Use  . Vaping Use: Never used  Substance and Sexual Activity  . Alcohol use: No  . Drug use: No  . Sexual activity: Yes    Birth control/protection: None  Other Topics Concern  .  Not on file  Social History Narrative  . Not on file   Social Determinants of Health   Financial Resource Strain: Not on file  Food Insecurity: No Food Insecurity  . Worried About [redacted]w[redacted]d in the Last Year: Never true  . Ran Out of Food in the Last Year: Never true  Transportation Needs: No Transportation Needs  . Lack of Transportation (Medical): No  . Lack of Transportation (Non-Medical): No  Physical Activity: Not on file  Stress: Not on file  Social Connections: Not on file  Intimate Partner Violence: Not on file    Objective  VS normal and stable in the ED   General: Well nourished, well developed female in no acute distress.  Skin:  Warm and dry.  Respiratory:  Normal respiratory effort Abdomen: soft, nttp, firm fundus at the umbilicus, bleeding none Neuro/Psych:  Normal mood and affect.  Pelvic: EGBUS and perineum normal, no tears  Labs  pending Recent Labs  Lab 03/05/21 2258  WBC 15.0*  HGB 13.4  HCT 39.7  PLT 285  Radiology none   Assessment & Plan   40 y.o. [id number] s/p PTL/PTB @ 34/4; pt doing well Routine care. Female infant. F/u admit labs  Interpreter used  Cornelia Copa MD Attending [facility])

## 2021-03-05 NOTE — ED Notes (Signed)
Pt transported to [facility].

## 2021-03-05 NOTE — ED Notes (Signed)
NICU team, OB-GYN present.

## 2021-03-05 NOTE — ED Provider Notes (Signed)
[facility] EMERGENCY DEPARTMENT Provider Note   CSN: [id number] Arrival date & time: 03/05/21  2224     History Chief Complaint  Patient presents with  . Laboring    Kristi Nguyen is a 40 y.o. female.  HPI   This patient is a 40 year old female who presents from home by private vehicle with labor.  The patient is approximately [redacted] weeks gestation, this is her sixth pregnancy, she has 3 live children, 2 miscarriages and has had 1 C-section with her second delivery.  She reports having some increasing pain that started this morning, it became more frequent throughout the day, as she was walking into the hospital from her car her water broke.  She reports that she has no other medical problems, takes no other daily medications, denies having any complications with this pregnancy, she last saw her OB/GYN within the last week.  Symptoms are persistent, gradually worsening, they are moderate to severe on arrival  Interpreter used for the duration of history and physical.  Past Medical History:  Diagnosis Date  . GDM (gestational diabetes mellitus)   . HGSIL (high grade squamous intraepithelial lesion) on Pap smear of cervix 2014    Patient Active Problem List   Diagnosis Date Noted  . Special educational needs 02/21/2021  . History of VBAC 02/21/2021  . Supervision of high risk pregnancy, antepartum 02/20/2021  . Advanced maternal age in multigravida 02/20/2021  . Gestational diabetes 02/20/2021  . History of C-section 02/20/2021  . 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 CESAREAN SECTION    . LEEP       OB History    Gravida  6   Para  3   Term  3   Preterm  0   AB  2   Living  3     SAB  1   IAB  0   Ectopic  0   Multiple  0   Live Births  3           No family history on file.  Social History   Tobacco Use  . Smoking status: Never Smoker  . Smokeless tobacco: Never  Used  Vaping Use  . Vaping Use: Never used  Substance Use Topics  . Alcohol use: No  . Drug use: No    Home Medications Prior to Admission medications   Medication Sig Start Date End Date Taking? Authorizing Provider  Prenatal Vit-Fe Fumarate-FA (PREPLUS) 27-1 MG TABS Take 1 tablet by mouth daily. 03/04/21   Hermina Staggers, MD    Allergies    Patient has no known allergies.  Review of Systems   Review of Systems  All other systems reviewed and are negative.   Physical Exam Updated Vital Signs LMP 07/14/2020   Physical Exam Vitals and nursing note reviewed.  Constitutional:      General: She is in acute distress.     Appearance: She is well-developed.  HENT:     Head: Normocephalic and atraumatic.     Mouth/Throat:     Pharynx: No oropharyngeal exudate.  Eyes:     General: No scleral icterus.       Right eye: No discharge.        Left eye: No discharge.     Conjunctiva/sclera: Conjunctivae normal.     Pupils: Pupils are equal, round, and reactive to light.  Neck:     Thyroid: No thyromegaly.  Vascular: No JVD.  Cardiovascular:     Rate and Rhythm: Normal rate and regular rhythm.     Heart sounds: Normal heart sounds. No murmur heard. No friction rub. No gallop.   Pulmonary:     Effort: Pulmonary effort is normal. No respiratory distress.     Breath sounds: Normal breath sounds. No wheezing or rales.  Abdominal:     General: Bowel sounds are normal. There is no distension.     Palpations: Abdomen is soft. There is no mass.     Tenderness: There is abdominal tenderness.  Genitourinary:    Comments: Chaperone present for vaginal exam, there is clear fluid leaking from the vagina, the fetus is at low station, cervix is completely dilated Musculoskeletal:        General: No tenderness. Normal range of motion.     Cervical back: Normal range of motion and neck supple.  Lymphadenopathy:     Cervical: No cervical adenopathy.  Skin:    General: Skin is warm  and dry.     Findings: No erythema or rash.  Neurological:     Mental Status: She is alert.     Coordination: Coordination normal.     Comments: Awake alert talking  Psychiatric:        Behavior: Behavior normal.     ED Results / Procedures / Treatments   Labs (all labs ordered are listed, but only abnormal results are displayed) Labs Reviewed  CBC  RPR  TYPE AND SCREEN   Radiology No results found.  Procedures OB Delivery  Date/Time: 03/05/2021 11:01 PM Performed by: Eber Hong, MD Authorized by: Eber Hong, MD   Consent Done?:  Emergent Situation Risks, benefits and alternatives were discussed: not applicable   Delivery Summary For::  Mother Procedure Details - Mother:    Presentation::  Occiput posterior   Amniotic sac:  Leaking membranes   Amniotic fluid:  Clear   Shoulder dystocia:  None   Episiotomy:  None   Cord Complication::  Nuchal   Number of loops::  1   Cord Around::  Neck   Cord delayed clamping:: No     Child Living status::  Yes   Placenta Delivered::  Yes   Placenta Removal::  Not Done Prior to Transfer   Vaginal Packing::  None        Medications Ordered in ED Medications - No data to display  ED Course  I have reviewed the triage vital signs and the nursing notes.  Pertinent labs & imaging results that were available during my care of the patient were reviewed by me and considered in my medical decision making (see chart for details).    MDM Rules/Calculators/A&P                          Within approximately 15 minutes of arrival the patient started to labor more heavily and within 2 or 3 pushes the fetus was delivered successfully.  There was 1 nuchal cord which I reduced immediately at the perineum upon delivery of the head.  The child looked well, the mother did well, placenta being delivered by OB/GYN which arrived just after delivery at this time.  Patient's vital signs are reassuring, she feels well after delivery.  She  will be transferred to labor and delivery under the care of the OB/GYN service.  Final Clinical Impression(s) / ED Diagnoses Final diagnoses:  Spontaneous vaginal delivery    Rx / DC Orders  ED Discharge Orders    None       Eber Hong, MD 03/05/21 2303

## 2021-03-05 NOTE — ED Notes (Signed)
Pt delivered at 2247.

## 2021-03-05 NOTE — ED Notes (Signed)
Placenta delivered at 2256.

## 2021-03-06 ENCOUNTER — Other Ambulatory Visit: Payer: Self-pay

## 2021-03-06 ENCOUNTER — Encounter (HOSPITAL_COMMUNITY): Payer: Self-pay | Admitting: Obstetrics and Gynecology

## 2021-03-06 DIAGNOSIS — O99893 Other specified diseases and conditions complicating puerperium: Secondary | ICD-10-CM

## 2021-03-06 DIAGNOSIS — M6208 Separation of muscle (nontraumatic), other site: Secondary | ICD-10-CM

## 2021-03-06 LAB — RESP PANEL BY RT-PCR (FLU A&B, COVID) ARPGX2
Influenza A by PCR: NEGATIVE
Influenza B by PCR: NEGATIVE
SARS Coronavirus 2 by RT PCR: NEGATIVE

## 2021-03-06 LAB — GLUCOSE, CAPILLARY: Glucose-Capillary: 100 mg/dL — ABNORMAL HIGH (ref 70–99)

## 2021-03-06 LAB — RPR: RPR Ser Ql: NONREACTIVE

## 2021-03-06 LAB — TYPE AND SCREEN
ABO/RH(D): O POS
Antibody Screen: NEGATIVE

## 2021-03-06 MED ORDER — PRENATAL MULTIVITAMIN CH
1.0000 | ORAL_TABLET | Freq: Every day | ORAL | Status: DC
  Administered 2021-03-06: 1 via ORAL
  Filled 2021-03-06: qty 1

## 2021-03-06 MED ORDER — COCONUT OIL OIL
1.0000 "application " | TOPICAL_OIL | Status: DC | PRN
  Administered 2021-03-06: 1 via TOPICAL

## 2021-03-06 MED ORDER — DOCUSATE SODIUM 100 MG PO CAPS: 100.0000 mg | ORAL_CAPSULE | Freq: Two times a day (BID) | ORAL | Status: DC | PRN

## 2021-03-06 MED ORDER — OXYTOCIN 10 UNIT/ML IJ SOLN
10.0000 [IU] | Freq: Once | INTRAMUSCULAR | Status: AC
  Administered 2021-03-05: 10 [IU] via INTRAMUSCULAR

## 2021-03-06 MED ORDER — WITCH HAZEL-GLYCERIN EX PADS: 1.0000 "application " | MEDICATED_PAD | CUTANEOUS | Status: DC | PRN

## 2021-03-06 MED ORDER — ONDANSETRON HCL 4 MG PO TABS: 4.0000 mg | ORAL_TABLET | ORAL | Status: DC | PRN

## 2021-03-06 MED ORDER — TETANUS-DIPHTH-ACELL PERTUSSIS 5-2.5-18.5 LF-MCG/0.5 IM SUSY: 0.5000 mL | PREFILLED_SYRINGE | Freq: Once | INTRAMUSCULAR | Status: DC

## 2021-03-06 MED ORDER — ONDANSETRON HCL 4 MG/2ML IJ SOLN: 4.0000 mg | INTRAMUSCULAR | Status: DC | PRN

## 2021-03-06 MED ORDER — DIBUCAINE (PERIANAL) 1 % EX OINT
1.0000 "application " | TOPICAL_OINTMENT | CUTANEOUS | Status: DC | PRN
  Filled 2021-03-06: qty 28

## 2021-03-06 MED ORDER — DIPHENHYDRAMINE HCL 25 MG PO CAPS: 25.0000 mg | ORAL_CAPSULE | Freq: Four times a day (QID) | ORAL | Status: DC | PRN

## 2021-03-06 MED ORDER — SIMETHICONE 80 MG PO CHEW: 80.0000 mg | CHEWABLE_TABLET | ORAL | Status: DC | PRN

## 2021-03-06 MED ORDER — OXYTOCIN-SODIUM CHLORIDE 30-0.9 UT/500ML-% IV SOLN: 2.5000 [IU]/h | INTRAVENOUS | Status: DC | PRN

## 2021-03-06 MED ORDER — BENZOCAINE-MENTHOL 20-0.5 % EX AERO
1.0000 "application " | INHALATION_SPRAY | CUTANEOUS | Status: DC | PRN
  Administered 2021-03-06: 1 via TOPICAL
  Filled 2021-03-06 (×2): qty 56

## 2021-03-06 MED ORDER — ZOLPIDEM TARTRATE 5 MG PO TABS: 5.0000 mg | ORAL_TABLET | Freq: Every evening | ORAL | Status: DC | PRN

## 2021-03-06 NOTE — Progress Notes (Signed)
OBRR called to ED for pregnant patient who thinks her water it broken.

## 2021-03-06 NOTE — Progress Notes (Signed)
OBRR arrived to ED trauma B. Per ED MD pt is complete and having urge to push. Pt approximately 34-35 weeks. S8N4 with previous section. OBRR checked patient and she is 10/100/+2/3. Pt involuntarily pushing with next contraction. Imminent delivery called. External fetal monitor adjusted. Delivery of viable female infant at 2247. Dr. Vergie Living arrived to ED around 2249. 10mg  of IM pitocin given per MD order. NICU at bedside to eval baby. Placenta delivered at 2256. OBRR did first fundal rub at 2300-fundus firm at U with small amount of bleeding. NICU MD at bedside talking with parents. Pt will be transfer to MAU for recovery.

## 2021-03-06 NOTE — Progress Notes (Signed)
Patient screened out for psychosocial assessment since none of the following apply:  Psychosocial stressors documented in mother or baby's chart  Gestation less than 32 weeks  Code at delivery   Infant with anomalies Please contact the Clinical Social Worker if specific needs arise, by MOB's request, or if MOB scores greater than 9/yes to question 10 on Edinburgh Postpartum Depression Screen.  Alexzandria Massman, LCSW Clinical Social Worker [facility] Cell#: [phone]     

## 2021-03-06 NOTE — Progress Notes (Signed)
POSTPARTUM PROGRESS NOTE  PPD #1  Subjective:  Kristi Nguyen is a 40 y.o. [id number] s/p VBAC at [redacted]w[redacted]d. Today she notes no acute complaints. She denies any problems with ambulating, voiding or po intake. Denies nausea or vomiting. She has + flatus, noBM.  Pain is well controlled.  Lochia minimal to moderate Denies fever/chills/chest pain/SOB.  no HA, no blurry vision, noRUQ pain  Objective: Blood pressure (!) 90/48, pulse 70, temperature 98.1 F (36.7 C), temperature source Oral, resp. rate 18, height 5\' 3"  (1.6 m), weight 75.1 kg, last menstrual period 07/14/2020, SpO2 100 %, unknown if currently breastfeeding.  Physical Exam:  General: alert, cooperative and no distress Chest: no respiratory distress Heart:regular rate, distal pulses intact Abdomen: soft, nontender Uterine Fundus: firm, appropriately tender DVT Evaluation: No calf swelling or tenderness Extremities: no edema Skin: warm, dry  Results for orders placed or performed during the hospital encounter of 03/05/21 (from the past 24 hour(s))  CBC     Status: Abnormal   Collection Time: 03/05/21 10:58 PM  Result Value Ref Range   WBC 15.0 (H) 4.0 - 10.5 K/uL   RBC 4.22 3.87 - 5.11 MIL/uL   Hemoglobin 13.4 12.0 - 15.0 g/dL   HCT 03/07/21 95.0 - 93.2 %   MCV 94.1 80.0 - 100.0 fL   MCH 31.8 26.0 - 34.0 pg   MCHC 33.8 30.0 - 36.0 g/dL   RDW 67.1 24.5 - 80.9 %   Platelets 285 150 - 400 K/uL   nRBC 0.0 0.0 - 0.2 %  Type and screen [facility]     Status: None   Collection Time: 03/05/21 10:58 PM  Result Value Ref Range   ABO/RH(D) O POS    Antibody Screen NEG    Sample Expiration      03/08/2021,2359 Performed at [facility], [address] [place], [place]   RPR     Status: None   Collection Time: 03/05/21 10:58 PM  Result Value Ref Range   RPR Ser Ql NON REACTIVE NON REACTIVE  Resp Panel by RT-PCR (Flu A&B, Covid) Nasopharyngeal Swab     Status: None   Collection Time:  03/06/21 12:37 AM   Specimen: Nasopharyngeal Swab; Nasopharyngeal(NP) swabs in vial transport medium  Result Value Ref Range   SARS Coronavirus 2 by RT PCR NEGATIVE NEGATIVE   Influenza A by PCR NEGATIVE NEGATIVE   Influenza B by PCR NEGATIVE NEGATIVE  Glucose, capillary     Status: Abnormal   Collection Time: 03/06/21  6:59 AM  Result Value Ref Range   Glucose-Capillary 100 (H) 70 - 99 mg/dL    Assessment/Plan: Kristi Nguyen is a 40 y.o. 24 s/p VBAC at [redacted]w[redacted]d PPD#1  -tolerating diet -pain controlled -meeting postpartum milestones appropriately  -baby in NICU   Dispo: continue with routine postpartum care   LOS: 1 day   [redacted]w[redacted]d, DO Faculty Attending, [facility] 03/06/2021, 12:51 PM

## 2021-03-07 DIAGNOSIS — G8918 Other acute postprocedural pain: Secondary | ICD-10-CM

## 2021-03-07 DIAGNOSIS — O99355 Diseases of the nervous system complicating the puerperium: Secondary | ICD-10-CM

## 2021-03-07 MED ORDER — IBUPROFEN 600 MG PO TABS: 600.0000 mg | ORAL_TABLET | Freq: Four times a day (QID) | ORAL | 0 refills | Status: AC

## 2021-03-07 MED ORDER — POLYETHYLENE GLYCOL 3350 17 G PO PACK: 17.0000 g | PACK | Freq: Every day | ORAL | 0 refills | Status: AC | PRN

## 2021-03-07 MED ORDER — ACETAMINOPHEN 325 MG PO TABS: 650.0000 mg | ORAL_TABLET | Freq: Four times a day (QID) | ORAL | Status: AC | PRN

## 2021-03-07 MED ORDER — COCONUT OIL OIL: 1.0000 "application " | TOPICAL_OIL | 0 refills | Status: AC | PRN

## 2021-03-07 MED ORDER — DOCUSATE SODIUM 100 MG PO CAPS: 100.0000 mg | ORAL_CAPSULE | Freq: Two times a day (BID) | ORAL | 0 refills | Status: AC | PRN

## 2021-03-07 NOTE — Discharge Instructions (Signed)

## 2021-03-07 NOTE — MAU Provider Note (Signed)
Chief Complaint  Patient presents with  . Laboring     S: Kristi Nguyen  is a 40 y.o. y.o. year old [phone] female at [redacted]w[redacted]d weeks gestation who presents to MAU from ED s/p precipitous SVD by Dr Vergie Living.  She is in MAU for recovery then will go to [facility].    See Dr Vergie Living' note for full H&P. Upon arrival pt is awake and alert, stable  O: AVSS per RNs General: NAD Heart: Regular rate Lungs: Normal rate and effort Abd: Soft, NT,  Extremities: WNL  Pelvic: Small lochia                Fundal massage by me, firm at umbilicus.   Large diastasis recti.   A: [redacted]w[redacted]d week IUP      S/P SVD of preterm infant, who was taken to NICU      Stable postpartum  P: RN to get patient up to empty bladder      Postpartum recovery per protocol then transfer to Marcy Siren, CNM

## 2021-03-07 NOTE — Plan of Care (Signed)
  Problem: Education: Goal: Knowledge of General Education information will improve Description: Including pain rating scale, medication(s)/side effects and non-pharmacologic comfort measures Outcome: Adequate for Discharge   

## 2021-03-07 NOTE — Discharge Summary (Addendum)
Postpartum Discharge Summary     Patient Name: Kristi Nguyen DOB: September 05, 1981 MRN: [id number]  Date of admission: 03/05/2021 Delivery date:03/05/2021  Delivering provider: Noemi Chapel  Date of discharge: 03/07/2021  Admitting diagnosis: Spontaneous vaginal delivery [O80] Preterm delivery [O60.10X0] Intrauterine pregnancy: [redacted]w[redacted]d     Secondary diagnosis:  Active Problems:   Advanced maternal age in multigravida   Gestational diabetes   History of C-section   Special educational needs   History of VBAC   Preterm delivery  Additional problems: as note above   Discharge diagnosis: Preterm Pregnancy Delivered (VBAC)                                             Post partum procedures: none Augmentation: N/A Complications: None  Hospital course: Onset of Labor With Vaginal Delivery      40 y.o. yo [id number] at [redacted]w[redacted]d was admitted in Active Labor on 03/05/2021. Patient had an uncomplicated labor course as follows:  Membrane Rupture Time/Date: 10:26 PM ,03/05/2021   Delivery Method:Vaginal, Spontaneous  Episiotomy: None  Lacerations:  None  Patient had an uncomplicated postpartum course.  She is ambulating, tolerating a regular diet, passing flatus, and urinating well. Patient is discharged home in stable condition on 03/07/21.  Newborn Data: Birth date:03/05/2021  Birth time:10:47 PM  Gender:  Living status:Living  Apgars:8 ,9  Weight:   Magnesium Sulfate received: No BMZ received: No secondary to precipitous delivery on arrival to MAU Rhophylac:N/A MMR:N/A T-DaP:Given prenatally Flu: offered prior to discharge Transfusion:No  Physical exam  Vitals:   03/06/21 2339 03/06/21 2340 03/07/21 0412 03/07/21 0807  BP: 94/61  (!) 90/56 (!) 93/57  Pulse: 80  77 69  Resp: 18   18  Temp: (!) 97.5 F (36.4 C)  98.7 F (37.1 C) 98.8 F (37.1 C)  TempSrc: Oral  Oral Oral  SpO2: 99% 98% 97% 98%  Weight:      Height:       General: alert, cooperative and no  distress Lochia: appropriate Uterine Fundus: firm Incision: N/A DVT Evaluation: No evidence of DVT seen on physical exam. No cords or calf tenderness. No significant calf/ankle edema. Labs: Lab Results  Component Value Date   WBC 15.0 (H) 03/05/2021   HGB 13.4 03/05/2021   HCT 39.7 03/05/2021   MCV 94.1 03/05/2021   PLT 285 03/05/2021   CMP Latest Ref Rng & Units 01/31/2018  Glucose 65 - 99 mg/dL 122(H)  BUN 6 - 20 mg/dL <5(L)  Creatinine 0.44 - 1.00 mg/dL 0.64  Sodium 135 - 145 mmol/L 135  Potassium 3.5 - 5.1 mmol/L 3.1(L)  Chloride 101 - 111 mmol/L 102  CO2 22 - 32 mmol/L 22  Calcium 8.9 - 10.3 mg/dL 8.8(L)  Total Protein 6.5 - 8.1 g/dL 7.3  Total Bilirubin 0.3 - 1.2 mg/dL 0.4  Alkaline Phos 38 - 126 U/L 66  AST 15 - 41 U/L 23  ALT 14 - 54 U/L 19   Edinburgh Score: No flowsheet data found.   After visit meds:  Allergies as of 03/07/2021   No Known Allergies      Medication List     TAKE these medications    acetaminophen 325 MG tablet Commonly known as: Tylenol Take 2 tablets (650 mg total) by mouth every 6 (six) hours as needed for mild pain, moderate pain, fever or headache (for pain scale <  4).   coconut oil Oil Apply 1 application topically as needed (nipple pain).   docusate sodium 100 MG capsule Commonly known as: COLACE Take 1 capsule (100 mg total) by mouth 2 (two) times daily as needed for mild constipation.   ibuprofen 600 MG tablet Commonly known as: ADVIL Take 1 tablet (600 mg total) by mouth every 6 (six) hours.   polyethylene glycol 17 g packet Commonly known as: MiraLax Take 17 g by mouth daily as needed for mild constipation or moderate constipation.   PrePLUS 27-1 MG Tabs Take 1 tablet by mouth daily.         Discharge home in stable condition Infant Feeding:  Pumping given baby in NICU Infant Disposition:NICU Discharge instruction: per After Visit Summary and Postpartum booklet. Activity: Advance as tolerated. Pelvic rest  for 6 weeks.  Diet: routine diet Future Appointments: Future Appointments  Date Time Provider [place]  03/18/2021  9:15 AM Chancy Milroy, MD [facility]  03/21/2021 10:15 AM [facility] [place]   Follow up Visit:  Message sent to [facility] by Dr. Astrid Drafts  Please schedule this patient for a In person postpartum visit in 6 weeks with the following provider: Any provider. Additional Postpartum F/U:Postpartum Depression checkup and 2 hour GTT  High risk pregnancy complicated by:  I1CVU, h/o Cesarean x1, preterm labor & delivery at 34w Delivery mode:  Vaginal, Spontaneous  (VBAC) Anticipated Birth Control:   plan for outpatient hormonal IUD (had Mirena prior)  Goswick, Gildardo Cranker, MD OB Fellow, Faculty [facility] 03/07/2021 9:37 AM  Attestation of Attending Supervision of Obstetric Fellow: Evaluation and management procedures were performed by the Obstetric Fellow under my supervision and collaboration.  I have reviewed the Obstetric Fellow's note and chart, and I agree with the management and plan. I have also made any necessary editorial changes.   Annalee Genta, DO Attending [place], [facility] for [facility], [place] [facility] 03/07/2021 1:18 PM

## 2021-03-07 NOTE — Progress Notes (Signed)
Used interpreter  [place] [phone]  For discharge teaching  Pt will go to [facility] and [facility]   For meds and breast pump   Husband with pt   And questions answered

## 2021-03-14 ENCOUNTER — Ambulatory Visit: Payer: Self-pay

## 2021-03-18 ENCOUNTER — Encounter: Payer: Self-pay | Admitting: Obstetrics and Gynecology

## 2021-03-18 ENCOUNTER — Other Ambulatory Visit: Payer: Self-pay

## 2021-03-21 ENCOUNTER — Other Ambulatory Visit: Payer: Self-pay

## 2021-03-21 ENCOUNTER — Encounter (HOSPITAL_COMMUNITY): Payer: Self-pay | Admitting: Obstetrics and Gynecology

## 2021-04-08 ENCOUNTER — Encounter: Payer: Self-pay | Admitting: Student

## 2021-04-08 ENCOUNTER — Ambulatory Visit (INDEPENDENT_AMBULATORY_CARE_PROVIDER_SITE_OTHER): Payer: Self-pay | Admitting: Student

## 2021-04-08 ENCOUNTER — Other Ambulatory Visit: Payer: Self-pay

## 2021-04-08 ENCOUNTER — Other Ambulatory Visit: Payer: Self-pay | Admitting: Student

## 2021-04-08 DIAGNOSIS — O24410 Gestational diabetes mellitus in pregnancy, diet controlled: Secondary | ICD-10-CM

## 2021-04-08 LAB — POCT URINALYSIS DIP (DEVICE)
Bilirubin Urine: NEGATIVE
Glucose, UA: NEGATIVE mg/dL
Ketones, ur: NEGATIVE mg/dL
Leukocytes,Ua: NEGATIVE
Nitrite: NEGATIVE
Protein, ur: NEGATIVE mg/dL
Specific Gravity, Urine: >=1.03 (ref 1.005–1.030)
Urobilinogen, UA: 0.2 mg/dL (ref 0.0–1.0)
pH: 5.5 (ref 5.0–8.0)

## 2021-04-08 MED ORDER — NORETHIN ACE-ETH ESTRAD-FE 1-20 MG-MCG(24) PO TABS: 1.0000 | ORAL_TABLET | Freq: Every day | ORAL | 11 refills | Status: DC

## 2021-04-08 NOTE — Progress Notes (Signed)
    Post Partum Visit Note  Kristi Nguyen is a 40 y.o. [id number] female who presents for a postpartum visit. She is 4 weeks postpartum following a normal spontaneous vaginal delivery.  I have fully reviewed the prenatal and intrapartum course. The delivery was at 34.4 gestational weeks.  Anesthesia: none. Postpartum course has been uncomplicated. Baby is doing well. Baby is feeding by breast. Bleeding staining only. Bowel function is normal. Bladder function is abnormal: buring . Patient is not sexually active. Contraception method is none. Postpartum depression screening: negative.   The pregnancy intention screening data noted above was reviewed. Potential methods of contraception were discussed. The patient elected to proceed with IUD at the [facility].    There are no preventive care reminders to display for this patient.  The following portions of the patient's history were reviewed and updated as appropriate: allergies, current medications, past family history, past medical history, past social history, past surgical history and problem list.  Review of Systems Pertinent items are noted in HPI.  Objective:  There were no vitals taken for this visit.   General:  alert, cooperative and no distress   Breasts:  not indicated  Lungs: clear to auscultation bilaterally  Heart:  regular rate and rhythm, S1, S2 normal, no murmur, click, rub or gallop  Abdomen: soft, non-tender; bowel sounds normal; no masses,  no organomegaly   Wound NA  GU exam:  not indicated       Assessment:    There are no diagnoses linked to this encounter. Healthy postpartum exam.   Plan:   Essential components of care per ACOG recommendations:  1.  Mood and well being: Patient with negative depression screening today. Reviewed local resources for support.  - Patient tobacco use? No.   - hx of drug use? No.    2. Infant care and feeding:  -Patient currently breastmilk feeding? Yes. Discussed  returning to work and pumping. Reviewed importance of draining breast regularly to support lactation.  -Social determinants of health (SDOH) reviewed in EPIC. No concerns The following needs were identified  3. Sexuality, contraception and birth spacing - Patient does not want a pregnancy in the next year.  Desired family size is 3 children.  - Reviewed forms of contraception in tiered fashion. Patient desired IUD at [facility] today.   - Discussed birth spacing of 18 months  4. Sleep and fatigue -Encouraged family/partner/community support of 4 hrs of uninterrupted sleep to help with mood and fatigue  5. Physical Recovery  - Discussed patients delivery and complications. She describes her labor as good. - Patient had a Vaginal, no problems at delivery. Patient had a No laceration. Perineal healing reviewed. Patient expressed understanding - Patient has urinary incontinence? No. - Patient is not safe to resume physical and sexual activity  6.  Health Maintenance - HM due items addressed Yes - Last pap smear December 2021 and normal Pap smear not done at today's visit.  -Breast Cancer screening indicated? No.   7. Chronic Disease/Pregnancy Condition follow up: Gestational Diabetes  -Patient had PP follow up GTT Today  - PCP follow up  Guy Begin, CMA Center for [employer], [facility]

## 2021-04-08 NOTE — Patient Instructions (Signed)
Llame al Departemento de Salud al siguiente numero:  [phone]  Con ese numero se puede conseguir cita

## 2021-04-09 LAB — GLUCOSE TOLERANCE, 2 HOURS
Glucose, 2 hour: 128 mg/dL (ref 65–139)
Glucose, GTT - Fasting: 96 mg/dL (ref 65–99)

## 2021-04-10 ENCOUNTER — Other Ambulatory Visit: Payer: Self-pay | Admitting: Student

## 2021-04-10 ENCOUNTER — Telehealth: Payer: Self-pay | Admitting: Student

## 2021-04-10 MED ORDER — NORETHIN ACE-ETH ESTRAD-FE 1-20 MG-MCG PO TABS: 1.0000 | ORAL_TABLET | Freq: Every day | ORAL | 11 refills | Status: AC

## 2021-04-10 NOTE — Telephone Encounter (Signed)
Called patient to inform her of the results of her diabetes test; patient asked to send another RX for COCs as the ones she was given are too expensive. New RX sent for Junel, and GOodRx coupon text to patient. Patient confirmed that she received GoodRx on her phone and that she will go to store to pick up.   Kristi Nguyen
